# Patient Record
Sex: Female | Born: 1990 | Race: White | Hispanic: No | Marital: Single | Smoking: Current every day smoker
Health system: Southern US, Community
[De-identification: ages and names within clinical notes are randomized; demographics above are authoritative.]

## PROBLEM LIST (undated history)

## (undated) DIAGNOSIS — N390 Urinary tract infection, site not specified: Secondary | ICD-10-CM

## (undated) DIAGNOSIS — F419 Anxiety disorder, unspecified: Secondary | ICD-10-CM

## (undated) DIAGNOSIS — N200 Calculus of kidney: Secondary | ICD-10-CM

## (undated) DIAGNOSIS — R102 Pelvic and perineal pain: Secondary | ICD-10-CM

## (undated) DIAGNOSIS — N83209 Unspecified ovarian cyst, unspecified side: Secondary | ICD-10-CM

## (undated) HISTORY — DX: Pelvic and perineal pain: R10.2

## (undated) HISTORY — PX: TONSILLECTOMY: SUR1361

## (undated) HISTORY — DX: Anxiety disorder, unspecified: F41.9

---
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
## 2009-01-14 ENCOUNTER — Ambulatory Visit: Payer: Self-pay | Admitting: Obstetrics and Gynecology

## 2009-01-14 ENCOUNTER — Encounter: Payer: Self-pay | Admitting: Obstetrics and Gynecology

## 2009-04-22 ENCOUNTER — Ambulatory Visit: Payer: Self-pay | Admitting: Obstetrics & Gynecology

## 2009-04-22 LAB — CONVERTED CEMR LAB: hCG, Beta Chain, Quant, S: <2 milliintl units/mL

## 2009-05-27 ENCOUNTER — Emergency Department: Payer: Self-pay | Admitting: Emergency Medicine

## 2009-06-01 ENCOUNTER — Emergency Department: Payer: Self-pay | Admitting: Emergency Medicine

## 2009-06-17 ENCOUNTER — Inpatient Hospital Stay (HOSPITAL_COMMUNITY): Admission: AD | Admit: 2009-06-17 | Discharge: 2009-06-17 | Payer: Self-pay | Admitting: Obstetrics and Gynecology

## 2009-06-19 ENCOUNTER — Emergency Department: Payer: Self-pay | Admitting: Emergency Medicine

## 2009-07-22 ENCOUNTER — Emergency Department: Payer: Self-pay | Admitting: Unknown Physician Specialty

## 2009-07-26 ENCOUNTER — Inpatient Hospital Stay: Payer: Self-pay | Admitting: Internal Medicine

## 2009-08-12 ENCOUNTER — Ambulatory Visit: Payer: Self-pay | Admitting: Nurse Practitioner

## 2009-08-26 ENCOUNTER — Encounter: Payer: Self-pay | Admitting: Obstetrics & Gynecology

## 2009-08-26 ENCOUNTER — Ambulatory Visit: Payer: Self-pay | Admitting: Nurse Practitioner

## 2009-08-26 ENCOUNTER — Encounter: Payer: Self-pay | Admitting: Nurse Practitioner

## 2009-08-26 LAB — CONVERTED CEMR LAB
Trich, Wet Prep: NONE SEEN
Yeast Wet Prep HPF POC: NONE SEEN

## 2009-08-30 ENCOUNTER — Emergency Department: Payer: Self-pay | Admitting: Unknown Physician Specialty

## 2009-09-02 ENCOUNTER — Ambulatory Visit: Payer: Self-pay | Admitting: Family Medicine

## 2009-09-02 LAB — CONVERTED CEMR LAB
Hemoglobin: 10.5 g/dL — ABNORMAL LOW (ref 12.0–15.0)
WBC: 14.4 10*3/uL — ABNORMAL HIGH (ref 4.0–10.5)

## 2009-09-03 ENCOUNTER — Ambulatory Visit (HOSPITAL_COMMUNITY): Admission: RE | Admit: 2009-09-03 | Discharge: 2009-09-03 | Payer: Self-pay | Admitting: Otolaryngology

## 2009-10-06 ENCOUNTER — Encounter: Payer: Self-pay | Admitting: Family Medicine

## 2009-10-06 ENCOUNTER — Ambulatory Visit: Payer: Self-pay | Admitting: Obstetrics and Gynecology

## 2009-10-27 ENCOUNTER — Ambulatory Visit: Payer: Self-pay | Admitting: Obstetrics and Gynecology

## 2009-10-27 ENCOUNTER — Encounter: Payer: Self-pay | Admitting: Family Medicine

## 2009-10-27 LAB — CONVERTED CEMR LAB
Chlamydia, DNA Probe: NEGATIVE
GC Probe Amp, Genital: NEGATIVE
HCT: 36.7 % (ref 36.0–46.0)
Hepatitis B Surface Ag: NEGATIVE
MCV: 78.9 fL (ref 78.0–100.0)
Platelets: 154 10*3/uL (ref 150–400)
RBC: 4.65 M/uL (ref 3.87–5.11)
Sed Rate: 12 mm/hr (ref 0–22)

## 2009-11-03 ENCOUNTER — Emergency Department: Payer: Self-pay | Admitting: Emergency Medicine

## 2009-11-24 ENCOUNTER — Encounter: Payer: Self-pay | Admitting: Obstetrics and Gynecology

## 2009-11-24 LAB — CONVERTED CEMR LAB
HCT: 38.8 % (ref 36.0–46.0)
MCV: 82.7 fL (ref 78.0–100.0)

## 2009-12-17 ENCOUNTER — Ambulatory Visit: Payer: Self-pay | Admitting: Obstetrics & Gynecology

## 2009-12-17 LAB — CONVERTED CEMR LAB: Chlamydia, Swab/Urine, PCR: NEGATIVE

## 2009-12-18 ENCOUNTER — Encounter: Payer: Self-pay | Admitting: Obstetrics & Gynecology

## 2009-12-18 LAB — CONVERTED CEMR LAB: Trich, Wet Prep: NONE SEEN

## 2010-01-20 ENCOUNTER — Ambulatory Visit: Payer: Self-pay | Admitting: Obstetrics and Gynecology

## 2010-01-20 ENCOUNTER — Encounter: Payer: Self-pay | Admitting: Obstetrics and Gynecology

## 2010-01-21 ENCOUNTER — Encounter: Payer: Self-pay | Admitting: Obstetrics and Gynecology

## 2010-01-21 LAB — CONVERTED CEMR LAB

## 2010-02-01 ENCOUNTER — Emergency Department: Payer: Self-pay | Admitting: Emergency Medicine

## 2010-02-11 ENCOUNTER — Emergency Department: Payer: Self-pay | Admitting: Emergency Medicine

## 2010-03-19 ENCOUNTER — Emergency Department: Payer: Self-pay | Admitting: Emergency Medicine

## 2010-03-25 ENCOUNTER — Ambulatory Visit: Payer: Self-pay | Admitting: Otolaryngology

## 2010-03-30 ENCOUNTER — Emergency Department: Payer: Self-pay | Admitting: Emergency Medicine

## 2010-05-12 ENCOUNTER — Encounter (INDEPENDENT_AMBULATORY_CARE_PROVIDER_SITE_OTHER): Payer: Self-pay | Admitting: *Deleted

## 2010-05-12 ENCOUNTER — Ambulatory Visit: Payer: Self-pay | Admitting: Nurse Practitioner

## 2010-05-12 LAB — CONVERTED CEMR LAB: Chlamydia, DNA Probe: NEGATIVE

## 2010-05-13 ENCOUNTER — Encounter (INDEPENDENT_AMBULATORY_CARE_PROVIDER_SITE_OTHER): Payer: Self-pay | Admitting: *Deleted

## 2010-05-13 LAB — CONVERTED CEMR LAB: Trich, Wet Prep: NONE SEEN

## 2010-07-08 ENCOUNTER — Emergency Department: Payer: Self-pay | Admitting: Unknown Physician Specialty

## 2010-07-13 ENCOUNTER — Ambulatory Visit: Payer: Self-pay | Admitting: Obstetrics and Gynecology

## 2010-07-13 LAB — CONVERTED CEMR LAB: Chlamydia, DNA Probe: NEGATIVE

## 2010-07-14 ENCOUNTER — Encounter: Payer: Self-pay | Admitting: Obstetrics and Gynecology

## 2010-07-14 LAB — CONVERTED CEMR LAB
Trich, Wet Prep: NONE SEEN
Yeast Wet Prep HPF POC: NONE SEEN

## 2010-09-15 ENCOUNTER — Ambulatory Visit: Payer: Self-pay | Admitting: Obstetrics & Gynecology

## 2010-09-15 LAB — CONVERTED CEMR LAB
GC Probe Amp, Genital: NEGATIVE
Yeast Wet Prep HPF POC: NONE SEEN

## 2010-11-09 ENCOUNTER — Encounter: Payer: Self-pay | Admitting: Urology

## 2010-11-16 ENCOUNTER — Emergency Department (HOSPITAL_COMMUNITY)
Admission: EM | Admit: 2010-11-16 | Discharge: 2010-11-16 | Payer: Self-pay | Source: Home / Self Care | Admitting: Emergency Medicine

## 2010-11-16 LAB — URINALYSIS, ROUTINE W REFLEX MICROSCOPIC
Ketones, ur: 15 mg/dL — AB
Nitrite: POSITIVE — AB

## 2010-11-16 LAB — POCT I-STAT, CHEM 8
BUN: 15 mg/dL (ref 6–23)
Calcium, Ion: 1.16 mmol/L (ref 1.12–1.32)
Creatinine, Ser: 1 mg/dL (ref 0.4–1.2)
Glucose, Bld: 100 mg/dL — ABNORMAL HIGH (ref 70–99)
TCO2: 29 mmol/L (ref 0–100)

## 2010-11-16 LAB — POCT PREGNANCY, URINE: Preg Test, Ur: NEGATIVE

## 2010-11-16 LAB — URINE MICROSCOPIC-ADD ON

## 2010-11-23 ENCOUNTER — Emergency Department: Payer: Self-pay | Admitting: Emergency Medicine

## 2011-01-20 LAB — CBC
MCHC: 34.5 g/dL (ref 30.0–36.0)
MCV: 84.9 fL (ref 78.0–100.0)
Platelets: 290 10*3/uL (ref 150–400)
RDW: 14.8 % (ref 11.5–15.5)
WBC: 9.6 10*3/uL (ref 4.0–10.5)

## 2011-02-01 LAB — URINALYSIS, ROUTINE W REFLEX MICROSCOPIC
Bilirubin Urine: NEGATIVE
Bilirubin Urine: NEGATIVE
Ketones, ur: NEGATIVE mg/dL
Ketones, ur: 15 mg/dL — AB
Nitrite: NEGATIVE
Nitrite: POSITIVE — AB
Protein, ur: NEGATIVE mg/dL
Urobilinogen, UA: 0.2 mg/dL (ref 0.0–1.0)
Urobilinogen, UA: 1 mg/dL (ref 0.0–1.0)

## 2011-02-01 LAB — POCT PREGNANCY, URINE: Preg Test, Ur: NEGATIVE

## 2011-02-01 LAB — URINE CULTURE: Colony Count: NO GROWTH

## 2011-02-01 LAB — RAPID URINE DRUG SCREEN, HOSP PERFORMED
Amphetamines: NOT DETECTED
Barbiturates: NOT DETECTED
Benzodiazepines: NOT DETECTED
Opiates: NOT DETECTED

## 2011-02-04 ENCOUNTER — Ambulatory Visit: Payer: Self-pay | Admitting: Obstetrics & Gynecology

## 2011-03-02 NOTE — Assessment & Plan Note (Signed)
NAME:  Amanda [name], Amanda [name] NO.:  [id number]   MEDICAL RECORD NO.:  [id number]          PATIENT TYPE:  POB   LOCATION:  [facility]         FACILITY:  [facility]   PHYSICIAN:  Tinnie Gens, MD        DATE OF BIRTH:  04/12/1991   DATE OF SERVICE:  08/12/2009                                  CLINIC NOTE   The patient comes to the office today for Implanon insertion.  The  patient had a recent TAB on July 05, 2009.  She has not had any  change in her sexual partner.  She has not had any unprotected  intercourse in the past 2 weeks.  Today, she has a negative urine  pregnancy test.  We did review the consent at length.  The patient is  aware of possible side effects including irregular bleeding and possible  infection at the site of insertion.  The patient is complaining of some  pain with intercourse which has been going on for the last 2 months.  She has not had her physical in a year and half.  Implanon insertion.  Area was cleaned and marked, site was injected with  3 mL of lidocaine.  Implanon was inserted with no difficulties.  Area  was cleaned and bandaged.  The patient was instructed to leave the  bandage on for 3 days.  She could loosen it if it became too tight.   ASSESSMENT:  Contraception.   PLAN:  1. Implanon insertion.  2. The patient is asked to use backup condoms for 1 month.  3. The patient is asked to observe this site for infection.  She will      return in 2 weeks for her yearly exam, at that time we will address      the pain with intercourse.  She can call the office if she is      having any problems or questions.       Remonia Richter, NP    ______________________________  Tinnie Gens, MD    LR/MEDQ  D:  08/12/2009  T:  08/13/2009  Job:  [id number]
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
## 2011-03-02 NOTE — Assessment & Plan Note (Signed)
NAME:  Amanda [name], Amanda [name] NO.:  [id number]   MEDICAL RECORD NO.:  [id number]          PATIENT TYPE:  POB   LOCATION:  [facility] at [facility]         FACILITY:  [facility]   PHYSICIAN:  Scheryl Darter, MD       DATE OF BIRTH:  05/15/1991   DATE OF SERVICE:                                  CLINIC NOTE   The patient comes to the office today for her yearly exam.  The patient  also has several complaints today.  The first is that of a right lower  quadrant pain that has been going on since she had an abortion in  September.  Approximately, 2-3 weeks ago, she was admitted to the  hospital for pyelo as well as kidney stones.  She was getting IV fluids,  IV antibiotics and asked to follow up with a urologist.  The patient did  not keep her appointment with the urologist as she did not have her co-  pay for that day.  The patient is also complaining of some throat pain  that has been ongoing for approximately 2 weeks.  [facility]  She was told that she had a virus in her throat and was given a  lidocaine spray.  Her throat has not improved, in fact it has gotten  worse.  The patient had an Implanon inserted on August 12, 2009.  She has not  had any difficulty with that other than having this spotting.   ALLERGIES:  No known drug allergies.   MEDICATIONS:  None.  She does have Implanon for contraception.   PHYSICAL EXAMINATION:  VITAL SIGNS:  Blood pressure is 114/76, pulse is  86, weight is 107, height is 5 feet 5.  GENERAL:  A well-developed, well-nourished 20 year old Caucasian female  in no acute distress.  HEENT AND NECK:  Head is normocephalic and atraumatic.  Pupils are equal  and reactive.  Her pharynx is red.  Her tonsils are quite enlarged.  There is no obvious exudate.  She does have extensive cervical  lymphadenopathy.  CARDIAC:  Regular rate and rhythm.  LUNGS:  Clear bilaterally.  ABDOMEN:  She does have some lower not right quadrant pain, but  lower  middle pain as this is more over her bladder  PELVIC:  Genitalia externally.  The mons is shaved.  Vaginal vault has  moderate amount of blood.  Cervix is closed.  There is very slight  cervical motion tenderness.  There is no adnexal pain.  There is again  more bladder pain.  EXTREMITIES:  Warm and dry.   ASSESSMENT:  1. Yearly exam.  2. Contraception.  3. Right lower quadrant pain.  4. Pharyngitis.   PLAN:  We will do her Pap smear today.  She will also have gonorrhea,  Chlamydia, wet prep and urine done today.  For contraception, she will  continue to use the Implanon.  Once we have gotten her release of  information from [facility] and reviewed her hospital records,  she may be given Motrin 800 mg t.i.d.  We will hold on this  until we know what her hospital report says about her kidneys.  For her  pharyngitis, the patient is given a Z-Pak to take as directed.  The  patient will call back for the results of her test.      Remonia Richter, NP    ______________________________  Scheryl Darter, MD    LR/MEDQ  D:  08/26/2009  T:  08/27/2009  Job:  [id number]

## 2011-03-02 NOTE — Assessment & Plan Note (Signed)
NAME:  Amanda [name], Amanda [name] NO.:  [id number]   MEDICAL RECORD NO.:  [id number]          PATIENT TYPE:  POB   LOCATION:  [facility] at [facility]         FACILITY:  [facility]   PHYSICIAN:  Argentina Donovan, MD        DATE OF BIRTH:  [birth year]   DATE OF SERVICE:  10/27/2009                                  CLINIC NOTE   The patient is an 20 year old Caucasian female who is in last month for  her yearly exam comes back today complaining of lower pelvic pain.  It  hurts when she presses on her abdomen.  It hurts when she puts and then  takes out a tampon.  It started about 2 days ago.  She has had  unprotected sex within the last week.   On examination, her abdomen is soft, flat, slightly tender  suprapubically and tenderness seems to be increased by pressure in the  upper abdomen, but it is referred to lower abdomen.  She does not,  however, have any guarding or rebound.  Pelvic examination, external  genitalia is normal.  BUS within normal limits.  Vagina is clean, with a  small amount of blood in the vault.  The patient is on  Implanon.  Cervix is clean, nulliparous.  Uterus is anterior and normal size.  The  adnexa could be easily palpated.  The whole area is somewhat tender, but  no chandelier sign and there is a little tenderness on any kind of  motion.   My feeling is that this patient perhaps is having an early episode of  PID.  I am going to put her on doxycycline as her last visit when she  was in she had a wet prep that showed clue cells that was not treated,  but I think using broad-spectrum since she has just started this  discomfort 2 days ago and seems to be increasing.  We are going to get a  sed rate and CBC.  The patient also wanted blood work done to rule out  any other sexually transmitted disease, so we are going to do that blood  work and we have gotten a probe for [employer] and chlamydia.   WORKING IMPRESSION:  Pelvic pain of 2 days' duration, possible early  pelvic  inflammatory disease.  The patient will come back in 3 days for  evaluation.           ______________________________  Argentina Donovan, MD     PR/MEDQ  D:  10/27/2009  T:  10/28/2009  Job:  [id number]

## 2011-03-02 NOTE — Assessment & Plan Note (Signed)
NAME:  Amanda [name], Amanda [name] NO.:  [id number]   MEDICAL RECORD NO.:  [id number]          PATIENT TYPE:  POB   LOCATION:  [facility]         FACILITY:  [facility]   PHYSICIAN:  Argentina Donovan, MD        DATE OF BIRTH:  [birth year]   DATE OF SERVICE:  04/02/2008                                  CLINIC NOTE   HISTORY OF PRESENT ILLNESS:  The patient is a 20 year old, gravida 3,  para 0-0-2-0 at 34 and 1 week's gestation who came in earlier than her  routine visit because of sore throat, earaches bilaterally, and  generalized myalgia.  The patient denies coughing or sneezing.  No one  in the family has had any like infection and her temperature has been  fairly normal with the highest at 99.7.   PHYSICAL EXAMINATION:  HEENT:  The throat was markedly injected without  exudate.  The ear examination is normal.  Submandibular adenopathy,  especially on the left, with fairly tender lymph nodes.  NECK:  Supple.  LUNGS:  Clear to auscultation and percussion.  HEART:  No murmurs.  Normal sinus rhythm.  ABDOMEN:  Soft, flat, and nontender.  No masses or organomegaly.   HOSPITAL COURSE:  The patient also during her first OB visit 3 weeks ago  had a positive Chlamydia probe, was treated on March 20, 2008, with  Zithromax.  Repeat Chlamydia probe was taken today for cure.  The  impression is that the patient has a viral upper respiratory infection.  We instructed her to return in 1-week for followup.  If the infection  seems to be getting worse and she is not getting better over the next  few days, she should call and so we did send her into the emergency room  for a flu test.  The patient has been instructed to get as much bedrest  as possible, drink plenty of fluids, and take Tylenol  for her muscle  pain.  The patient states that she did take her Zithromax as instructed.  Her partner, however, has not been treated.  She has not had sex with  him and will not until he is treated and  she states that he is no way of  getting [facility] and no money or insurance for private  doctor to be treated.  We discussed the importance of him being treated  and hope that she will convince him to follow through.   IMPRESSION:  Viral upper respiratory infection, Chlamydia probe for  cure.  Fetal heart during this visit was normal.  Blood pressure was  111/62.  The patient weighed 122 pounds.           ______________________________  Argentina Donovan, MD     PR/MEDQ  D:  04/02/2008  T:  04/03/2008  Job:  [id number]

## 2011-03-02 NOTE — Assessment & Plan Note (Signed)
NAME:  Amanda, [name] NO.:  [id number]   MEDICAL RECORD NO.:  [id number]          PATIENT TYPE:  POB   LOCATION:  [facility]         FACILITY[facility]  [facility]   PHYSICIAN:  Catalina Antigua, MD     DATE OF BIRTH:  1991-03-14   DATE OF SERVICE:  01/20/2010                                  CLINIC NOTE   HISTORY OF PRESENT ILLNESS:  This is a 20 year old G3, P1-0-2-1, who  presents today for evaluation of pelvic pain and vaginal discharge.  The  patient states that she was seen earlier in March for the same  complaints and was found to have yeast and bacterial vaginosis and was  appropriately treated with Diflucan and Flagyl.  States that her  symptoms have resolved.  She recently completed a course of amoxicillin  for strep throat and now her symptoms are recurring.  She describes a  thick vaginal discharge, dyspareunia, and vaginal pruritus.  She is also  complaining of pain with urination and feels that experiences frequency,  but is only able to void a small amount.   PHYSICAL EXAMINATION:  VITAL SIGNS:  Blood pressure is 109/64, pulse of  69, weight of 115 pounds, and height 5 feet 5 inches.  ABDOMEN:  Soft, nontender, nondistended.  PELVIC:  Normal-appearing external genitalia.  Normal vaginal mucosa and  cervix with thin white discharge.  No abnormal bleeding.  On bimanual  exam, she has small anteverted uterus, no palpable adnexal masses or  tenderness.  Her urine analysis demonstrated trace blood and 3+ protein.   ASSESSMENT AND PLAN:  This is a 19 year old who presents for evaluation  of pelvic pain and vaginal discharge, status post course of antibiotics  with prep was performed today and the urine culture was sent.  The  patient will be contacted with any abnormal results.  Her symptoms that  are likely due to recurrence of either yeast infection or bacterial  vaginosis.           ______________________________  Catalina Antigua, MD     PC/MEDQ  D:   01/20/2010  T:  01/21/2010  Job:  [id number]

## 2011-03-02 NOTE — Assessment & Plan Note (Signed)
NAME:  Amanda [name], Amanda [name] NO.:  [id number]   MEDICAL RECORD NO.:  [id number]          PATIENT TYPE:  POB   LOCATION:  [facility]         FAC[facility]   PHYSICIAN:  Argentina Donovan, MD        DATE OF BIRTH:  1991/02/14   DATE OF SERVICE:                                  CLINIC NOTE   HISTORY OF PRESENT ILLNESS:  The patient is a 20 year old Caucasian  female gravida 3, para 1-0-2-1 whose child is 64 months old, and then in  some time last year she had a pregnancy termination after that went on  Implanon for contraception.  Since that time, she has had problems with  the increased vaginal discharge, irritating, sometimes odor is difficult  to describe but heavy.  She has also been bothered by sharp intermittent  stabbing lower abdominal pains in both lower quadrants, but mainly on  the left.  She has been treated in recent past for Trichomonas,  bacterial vaginosis, and yeast which were confirmed by wet prep and  cultures.  She comes in today after having had her last treatment back  at the end of July beginning of August with the same complaints, sharp,  intermittent, stabbing lower abdominal pain and extremely heavy vaginal  discharge.  On examination, her abdomen is soft, flat, nontender.  No  masses or organomegaly.  No deep palpation significant pain.  No  rebound.  External genitalia is normal.  BUN is within normal limits.  The vagina was well rugated with extremely heavy creamy white  leukorrhea, thick and pasty in nature, and non foul with a negative  whiff.  GC and Chlamydia probe was taken from the cervix and wet prep  was also taken.  On bimanual examination, the seem to be a fullness and  certainly a tenderness in the left lower quadrant giving impression of a  small ovarian cyst.  The right lower quadrant was somewhat benign and  the uterus did not elicit pain on motion.   IMPRESSION:  Heavy leukorrhea, pending laboratory tests and pelvic  pain.   PLAN:  I am going to get an ultrasound, have her come back in 2 weeks  for those results and then depending on the wet prep and probes  discussed the possibility of treatment with this patient.  The fact is  that most of these things the heavy discharge started since the Implanon  and so I have talked to the patient about the possibility of either  changing her contraception method or doing something to increase vaginal  acidity to try and make these infections to occur less.           ______________________________  Argentina Donovan, MD     PR/MEDQ  D:  07/13/2010  T:  07/14/2010  Job:  [id number]

## 2011-03-02 NOTE — Assessment & Plan Note (Signed)
NAME:  Amanda [name], Amanda [name] NO.:  [id number]   MEDICAL RECORD NO.:  [id number]          PATIENT TYPE:  POB   LOCATION:  [facility] at [facility]         FACILITY:  [facility]   PHYSICIAN:  Scheryl Darter, MD       DATE OF BIRTH:  01/30/1991   DATE OF SERVICE:  05/12/2010                                  CLINIC NOTE   The patient comes to office today complaining of an ongoing vaginal  discharge.  She states this vaginal discharge started approximately 1  month ago.  It does have an odor, but she cannot seem to describe that  odor.  She feels that the amount is excessive and in fact wears a pad  and sometimes a tampon.  She did call the office about 2 weeks ago,  described her symptoms, and was called in a Diflucan and that was not  helpful.  She does not have a new sexual partner.  She is currently  sexually active.  She has Implanon for contraception.   PHYSICAL EXAMINATION:  VITAL SIGNS:  Blood pressure is 89/61, pulse is  95, weight is 113.  GENERAL:  A well-developed, well-nourished 20 year old Caucasian female  in no acute distress.  GENITALIA:  Externally, the mons is shaved.  There are no lesions or  discharge.  Internally, vaginal vault, there is a very scant amount of  white discharge, nonodorous.  Cervix is without lesions or discharges.  Bimanual exam, no cervical motion tenderness.  No adnexal mass.   ASSESSMENT:  Vaginal discharge.   PLAN:  We will do a wet prep, GC and Chlamydia today.  We did have  discussion about the vaginal discharge in females as well as pelvic pain  and sexual activity.  She will follow up as needed.      Remonia Richter, NP    ______________________________  Scheryl Darter, MD    LR/MEDQ  D:  05/12/2010  T:  05/13/2010  Job:  [id number]

## 2011-03-02 NOTE — Assessment & Plan Note (Signed)
NAME:  Amanda [name], Amanda [name] NO.:  [id number]   MEDICAL RECORD NO.:  [id number]          PATIENT TYPE:  POB   LOCATION:  [facility] at [facility]         FACILITY:  [facility]   PHYSICIAN:  Scheryl Darter, MD       DATE OF BIRTH:  01-Nov-1990   DATE OF SERVICE:  12/17/2009                                  CLINIC NOTE   The patient comes today with 2 days noting a new piece of skin at the  introitus, it was uncomfortable with intercourse.  She has had some  vaginal itching and she suspects she may have yeast.  She has frequent  bleeding after insertion of Implanon about 4 months ago.  She had a  vaginal delivery 14 months ago.  She has no reports of ulceration, no  vaginal area.  She says her sexual partner has diagnosis of herpes, but  she has never had symptoms herself.  She said she has not been tested  for more sexually transmitted diseases.   PAST MEDICAL HISTORY:  Urinary tract infections.  She had previously  been followed by a urologist.   PHYSICAL EXAMINATION:  No acute distress.  Affect appears normal.  External genitalia show a well healed episiotomy or laceration in site  of the perineum.  It was not tender.  There is well-estrogenized vaginal  mucosa and cervix appears normal with a slight whitish discharge.  At  the introitus, there is a hymeneal ring remnants, but no warts or other  lesions.   IMPRESSION:  Symptoms at the introitus may be secondary to mild yeast  infection.  I see no new lesions today.   PLAN:  She said she has Diflucan which she can take and she gets home.  She wants more testing for STDs and we discussed screening for exposure  to herpes simplex virus.  She will come back to have serum test for  antibodies to HSV 1 and 2.  We will also do gonorrhea and chlamydia  probes from a urine specimen.  Urinalysis was negative today.  We will  notify her results for testing.      Scheryl Darter, MD     JA/MEDQ  D:  12/17/2009  T:  12/18/2009  Job:   [id number]

## 2011-03-02 NOTE — Assessment & Plan Note (Signed)
NAME:  Amanda [name], Amanda [name] NO.:  [id number]   MEDICAL RECORD NO.:  [id number]          PATIENT TYPE:  POB   LOCATION:  [facility] at [facility]         FACILITY:  [facility]   PHYSICIAN:  Argentina Donovan, MD        DATE OF BIRTH:  April 01, 1991   DATE OF SERVICE:  01/13/2009                                  CLINIC NOTE   The patient is an 20 year old Caucasian female gravida 3, para 1-0-2-0  who delivered her baby on September 26, 2008, by normal spontaneous  delivery.  She has not been back for her 6-week checkup.  She comes in  today complaining of little bumps around her vaginal area.  She is also  due for a Pap smear and wanted to start on birth control pills.  She  said the bumps came up about a week ago and they have been very itchy.  She has had a recent history of scabies, which she thought was cleared  up.   On examination, the vulva and the external genitalia has been shaven.  There were small umbilicated lesions measuring about 2-mm in diameter,  mostly on the left side of the labia majora in the groin area and in the  mound of venus.  These are very typical molluscum contagiosum.  The  external genitalia otherwise is normal.  BUN is within normal limits.  Vagina is clean and well rugated.  Cervix, clean and parous.  Uterus is  anterior, normal size, shape, consistency.  The adnexa is normal.  Pap  smear was taken.  I have discussed the condition with the patient.  I  told to make sure she washes her hands if she touches or scratches that  area before she plays with the baby as children can pick this up also,  and that if it does not go away within a couple months to see  dermatologist who can use a little curetting to get rid off them, but  most of the time, they are going to go away on their own.  I counseled  her to stop smoking that may help reduce the problem there and she  wanted the birth control pills.  She has been on Yaz and Ortho Tri-  Cyclen, had problems with  bleeding, so we are going to start her on  Loestrin Fe 24.  I have counseled her on how to take them.  Her blood  pressure today is 111/77, pulse of 81.  She was 119 pounds, 5 feet 5  inches tall.  Given her a prescription for a year.   IMPRESSION:  Molluscum contagiosum.  Routine gynecologic examination  with Pap smear taken.           ______________________________  Argentina Donovan, MD     PR/MEDQ  D:  01/14/2009  T:  01/15/2009  Job:  [id number]
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
## 2011-07-20 LAB — WET PREP, GENITAL
Trich, Wet Prep: NONE SEEN
Yeast Wet Prep HPF POC: NONE SEEN
Yeast Wet Prep HPF POC: NONE SEEN

## 2011-07-20 LAB — URINALYSIS, ROUTINE W REFLEX MICROSCOPIC
Glucose, UA: NEGATIVE
Hgb urine dipstick: NEGATIVE
Ketones, ur: NEGATIVE
Nitrite: NEGATIVE
Nitrite: NEGATIVE
Protein, ur: NEGATIVE
Protein, ur: NEGATIVE
Specific Gravity, Urine: 1.015
Urobilinogen, UA: 0.2
pH: 6.5

## 2011-07-20 LAB — URINE CULTURE
Colony Count: 9000
Colony Count: NO GROWTH
Culture: NO GROWTH

## 2011-07-20 LAB — URINE MICROSCOPIC-ADD ON

## 2011-07-23 LAB — URINALYSIS, ROUTINE W REFLEX MICROSCOPIC
Ketones, ur: NEGATIVE mg/dL
Nitrite: NEGATIVE
pH: 7 (ref 5.0–8.0)

## 2011-07-23 LAB — CBC
Hemoglobin: 10.4 g/dL — ABNORMAL LOW (ref 12.0–15.0)
Platelets: 162 10*3/uL (ref 150–400)
RDW: 14 % (ref 11.5–15.5)
WBC: 11.4 10*3/uL — ABNORMAL HIGH (ref 4.0–10.5)

## 2011-07-27 LAB — HCG, QUANTITATIVE, PREGNANCY
hCG, Beta Chain, Quant, S: 45 — ABNORMAL HIGH
hCG, Beta Chain, Quant, S: 92 — ABNORMAL HIGH

## 2011-07-27 LAB — COMPREHENSIVE METABOLIC PANEL
ALT: 11
Calcium: 9.4
Creatinine, Ser: 0.59
Glucose, Bld: 87
Sodium: 139
Total Protein: 7.3

## 2011-07-27 LAB — CBC
Hemoglobin: 14.1
MCV: 89.8
RBC: 4.54
WBC: 9.1

## 2011-07-27 LAB — ABO/RH: ABO/RH(D): O POS

## 2011-08-04 LAB — DIFFERENTIAL
Basophils Absolute: 0
Basophils Relative: 0
Lymphocytes Relative: 12 — ABNORMAL LOW
Monocytes Absolute: 0.3
Monocytes Relative: 2 — ABNORMAL LOW
Neutro Abs: 10.9 — ABNORMAL HIGH
Neutrophils Relative %: 85 — ABNORMAL HIGH

## 2011-08-04 LAB — HEPATIC FUNCTION PANEL
ALT: 9
AST: 20
Albumin: 3.6
Alkaline Phosphatase: 45 — ABNORMAL LOW
Bilirubin, Direct: <0.1
Total Bilirubin: 0.5

## 2011-08-04 LAB — RPR: RPR Ser Ql: NONREACTIVE

## 2011-08-04 LAB — RAPID URINE DRUG SCREEN, HOSP PERFORMED
Amphetamines: NOT DETECTED
Benzodiazepines: POSITIVE — AB
Tetrahydrocannabinol: POSITIVE — AB

## 2011-08-04 LAB — URINALYSIS, ROUTINE W REFLEX MICROSCOPIC
Bilirubin Urine: NEGATIVE
Glucose, UA: NEGATIVE
Hgb urine dipstick: NEGATIVE
Ketones, ur: 40 — AB
Nitrite: NEGATIVE
Protein, ur: >300 — AB
Specific Gravity, Urine: 1.027
Urobilinogen, UA: 0.2
Urobilinogen, UA: 1
pH: 6

## 2011-08-04 LAB — I-STAT 8, (EC8 V) (CONVERTED LAB)
Acid-base deficit: 3 — ABNORMAL HIGH
Bicarbonate: 19.9 — ABNORMAL LOW
HCT: 41
Operator id: 282201
Sodium: 137
TCO2: 21
pCO2, Ven: 28.8 — ABNORMAL LOW

## 2011-08-04 LAB — CBC
Hemoglobin: 13.4
MCHC: 33.8
RBC: 4.46
RDW: 12.9

## 2011-08-04 LAB — GC/CHLAMYDIA PROBE AMP, URINE
Chlamydia, Swab/Urine, PCR: NEGATIVE
GC Probe Amp, Urine: NEGATIVE

## 2011-08-04 LAB — URINE MICROSCOPIC-ADD ON

## 2011-08-04 LAB — ACETAMINOPHEN LEVEL: Acetaminophen (Tylenol), Serum: <10 — ABNORMAL LOW

## 2011-08-04 LAB — POCT PREGNANCY, URINE
Operator id: 282201
Preg Test, Ur: NEGATIVE

## 2011-08-04 LAB — ETHANOL: Alcohol, Ethyl (B): <5

## 2011-08-05 ENCOUNTER — Emergency Department: Payer: Self-pay | Admitting: Emergency Medicine

## 2012-01-20 ENCOUNTER — Emergency Department (HOSPITAL_COMMUNITY)
Admission: EM | Admit: 2012-01-20 | Discharge: 2012-01-20 | Disposition: A | Discharge status: Home / Self Care | Payer: 59 | Attending: Emergency Medicine | Admitting: Emergency Medicine

## 2012-01-20 DIAGNOSIS — K006 Disturbances in tooth eruption: Secondary | ICD-10-CM | POA: Insufficient documentation

## 2012-01-20 DIAGNOSIS — K089 Disorder of teeth and supporting structures, unspecified: Secondary | ICD-10-CM | POA: Insufficient documentation

## 2012-01-20 DIAGNOSIS — K0889 Other specified disorders of teeth and supporting structures: Secondary | ICD-10-CM

## 2012-01-20 MED ORDER — OXYCODONE-ACETAMINOPHEN 5-325 MG PO TABS
2.0000 | ORAL_TABLET | Freq: Once | ORAL | Status: AC
  Administered 2012-01-20: 2 via ORAL
  Filled 2012-01-20: qty 2

## 2012-01-20 MED ORDER — OXYCODONE-ACETAMINOPHEN 5-325 MG PO TABS: 2.0000 | ORAL_TABLET | ORAL | Status: AC | PRN

## 2012-01-20 MED ORDER — PENICILLIN V POTASSIUM 500 MG PO TABS: 500.0000 mg | ORAL_TABLET | Freq: Four times a day (QID) | ORAL | Status: AC

## 2012-01-20 NOTE — ED Notes (Signed)
Pt c/o toothache on left side upper back tooth and lower back tooth. Constant stabbing pain radiates to ear. Appoint with oral surgeon next Thursday.

## 2012-01-20 NOTE — ED Provider Notes (Signed)
History     CSN: [id number]  Arrival date & time 01/20/12  1022   First MD Initiated Contact with Patient 01/20/12 1101      Chief Complaint  Patient presents with  . Dental Pain    (Consider location/radiation/quality/duration/timing/severity/associated sxs/prior treatment) HPI Comments: Patient presents with pain in her left upper and lower teeth for the past 3 weeks. She denies any trauma. She does not have any fever, difficulty breathing, difficulty swallowing, vomiting. She's no other medical problems. She taking Tylenol and ibuprofen without relief. His appointment with her oral surgeon next Thursday to get her wisdom teeth removed.  The history is provided by the patient.    No past medical history on file.  No past surgical history on file.  No family history on file.  History  Substance Use Topics  . Smoking status: Not on file  . Smokeless tobacco: Not on file  . Alcohol Use: Not on file    OB History    No data available      Review of Systems  Constitutional: Negative for fever, activity change and appetite change.  HENT: Positive for dental problem. Negative for sore throat, facial swelling and trouble swallowing.   Respiratory: Negative for cough and shortness of breath.   Cardiovascular: Negative for chest pain.  Gastrointestinal: Negative for nausea and vomiting.  Genitourinary: Negative for dysuria.  Musculoskeletal: Negative for back pain.  Neurological: Negative for headaches.    Allergies  Aspirin  Home Medications   Current Outpatient Rx  Name Route Sig Dispense Refill  . IBUPROFEN 200 MG PO TABS Oral Take 400 mg by mouth every 6 (six) hours as needed. For pain    . QUETIAPINE FUMARATE 50 MG PO TABS Oral Take 50 mg by mouth at bedtime.      BP 108/73  Pulse 80  Temp(Src) 98.3 F (36.8 C) (Oral)  Resp 16  SpO2 98%  Physical Exam  Constitutional: She is oriented to person, place, and time. She appears well-developed and  well-nourished. No distress.  HENT:  Head: Normocephalic.  Mouth/Throat: Oropharynx is clear and moist. No oropharyngeal exudate.         Floor of mouth soft  Eyes: Conjunctivae are normal. Pupils are equal, round, and reactive to light.  Neck: Neck supple.       No meningismus  Cardiovascular: Normal rate, regular rhythm and normal heart sounds.   Pulmonary/Chest: Effort normal and breath sounds normal. No respiratory distress.  Abdominal: Soft. There is no tenderness. There is no rebound and no guarding.  Musculoskeletal: Normal range of motion. She exhibits no edema and no tenderness.  Neurological: She is alert and oriented to person, place, and time. No cranial nerve deficit.  Skin: Skin is warm.    ED Course  Procedures (including critical care time)  Labs Reviewed - No data to display No results found.   No diagnosis found.    MDM  Dental pain with difficulty breathing or swallowing. No evidence of Ludwig's angina. No abscess  Pain control, followup with oral surgeon as scheduled        Glynn Octave, MD 01/20/12 [phone]

## 2012-01-20 NOTE — Discharge Instructions (Signed)
Dental Pain  A tooth ache may be caused by cavities (tooth decay). Cavities expose the nerve of the tooth to air and hot or cold temperatures. It may come from an infection or abscess (also called a boil or furuncle) around your tooth. It is also often caused by dental caries (tooth decay). This causes the pain you are having.  DIAGNOSIS   Your caregiver can diagnose this problem by exam.  TREATMENT   · If caused by an infection, it may be treated with medications which kill germs (antibiotics) and pain medications as prescribed by your caregiver. Take medications as directed.  · Only take over-the-counter or prescription medicines for pain, discomfort, or fever as directed by your caregiver.  · Whether the tooth ache today is caused by infection or dental disease, you should see your dentist as soon as possible for further care.  SEEK MEDICAL CARE IF:  The exam and treatment you received today has been provided on an emergency basis only. This is not a substitute for complete medical or dental care. If your problem worsens or new problems (symptoms) appear, and you are unable to meet with your dentist, call or return to this location.  SEEK IMMEDIATE MEDICAL CARE IF:   · You have a fever.  · You develop redness and swelling of your face, jaw, or neck.  · You are unable to open your mouth.  · You have severe pain uncontrolled by pain medicine.  MAKE SURE YOU:   · Understand these instructions.  · Will watch your condition.  · Will get help right away if you are not doing well or get worse.  Document Released: 10/04/2005 Document Revised: 09/23/2011 Document Reviewed: 05/22/2008  ExitCare® Patient Information ©2012 [employer].

## 2012-01-20 NOTE — ED Notes (Signed)
Discharge home with written and verbal instruction. Prescription and follow up care discussed.

## 2012-02-08 ENCOUNTER — Emergency Department (HOSPITAL_COMMUNITY)
Admission: EM | Admit: 2012-02-08 | Discharge: 2012-02-08 | Disposition: A | Discharge status: Home / Self Care | Payer: Self-pay | Attending: Emergency Medicine | Admitting: Emergency Medicine

## 2012-02-08 ENCOUNTER — Encounter (HOSPITAL_COMMUNITY): Payer: Self-pay | Admitting: *Deleted

## 2012-02-08 DIAGNOSIS — R1110 Vomiting, unspecified: Secondary | ICD-10-CM | POA: Insufficient documentation

## 2012-02-08 DIAGNOSIS — R070 Pain in throat: Secondary | ICD-10-CM | POA: Insufficient documentation

## 2012-02-08 DIAGNOSIS — K089 Disorder of teeth and supporting structures, unspecified: Secondary | ICD-10-CM | POA: Insufficient documentation

## 2012-02-08 NOTE — ED Notes (Signed)
Pt called for in waiting room x2 with no reply

## 2012-02-08 NOTE — ED Notes (Signed)
Toothache for 3 weeks and she has a sorethoat and vomiting for several days

## 2012-05-15 ENCOUNTER — Emergency Department: Payer: Self-pay | Admitting: Emergency Medicine

## 2012-05-15 LAB — DRUG SCREEN, URINE
Amphetamines, Ur Screen: NEGATIVE (ref ?–1000)
Barbiturates, Ur Screen: NEGATIVE (ref ?–200)
Benzodiazepine, Ur Scrn: POSITIVE (ref ?–200)
Cannabinoid 50 Ng, Ur ~~LOC~~: NEGATIVE (ref ?–50)
Cocaine Metabolite,Ur ~~LOC~~: NEGATIVE (ref ?–300)
MDMA (Ecstasy)Ur Screen: NEGATIVE (ref ?–500)
Opiate, Ur Screen: POSITIVE (ref ?–300)
Tricyclic, Ur Screen: NEGATIVE (ref ?–1000)

## 2012-05-15 LAB — SALICYLATE LEVEL: Salicylates, Serum: <1.7 mg/dL

## 2012-05-15 LAB — COMPREHENSIVE METABOLIC PANEL
Anion Gap: 8 (ref 7–16)
BUN: 12 mg/dL (ref 7–18)
Bilirubin,Total: 0.9 mg/dL (ref 0.2–1.0)
Calcium, Total: 8.4 mg/dL — ABNORMAL LOW (ref 8.5–10.1)
Chloride: 104 mmol/L (ref 98–107)
EGFR (African American): >60
Glucose: 89 mg/dL (ref 65–99)
Osmolality: 277 (ref 275–301)
Potassium: 3.6 mmol/L (ref 3.5–5.1)
SGPT (ALT): 20 U/L
Sodium: 139 mmol/L (ref 136–145)
Total Protein: 7 g/dL (ref 6.4–8.2)

## 2012-05-15 LAB — CBC
HCT: 35.6 % (ref 35.0–47.0)
Platelet: 126 10*3/uL — ABNORMAL LOW (ref 150–440)
RDW: 13.2 % (ref 11.5–14.5)
WBC: 4.5 10*3/uL (ref 3.6–11.0)

## 2012-05-15 LAB — URINALYSIS, COMPLETE
Blood: NEGATIVE
Ph: 6 (ref 4.5–8.0)
Protein: NEGATIVE
Specific Gravity: 1.015 (ref 1.003–1.030)
Squamous Epithelial: 8
WBC UR: 5 /HPF (ref 0–5)

## 2012-05-15 LAB — ETHANOL
Ethanol %: <0.003 % (ref 0.000–0.080)
Ethanol: <3 mg/dL

## 2012-05-15 LAB — PROTIME-INR
INR: 1.2
Prothrombin Time: 15.7 secs — ABNORMAL HIGH (ref 11.5–14.7)

## 2012-05-21 LAB — CULTURE, BLOOD (SINGLE)

## 2013-05-08 DIAGNOSIS — R102 Pelvic and perineal pain: Secondary | ICD-10-CM

## 2013-05-08 HISTORY — DX: Pelvic and perineal pain: R10.2

## 2013-07-25 IMAGING — CR DG CHEST 1V PORT
1 series · 1 of 1 positions shown · non-contrast
Comparison: none

REASON FOR EXAM: altered mental status
COMMENTS:

PROCEDURE:     DXR - DXR PORTABLE CHEST SINGLE VIEW  - May 15, 2012  [DATE]
RESULT:     The lungs are hypoinflated. There is no focal infiltrate. Mild
increase in the infrahilar lung markings on the left are present. There is
no pleural effusion. The mediastinum is normal in width.

[ap]
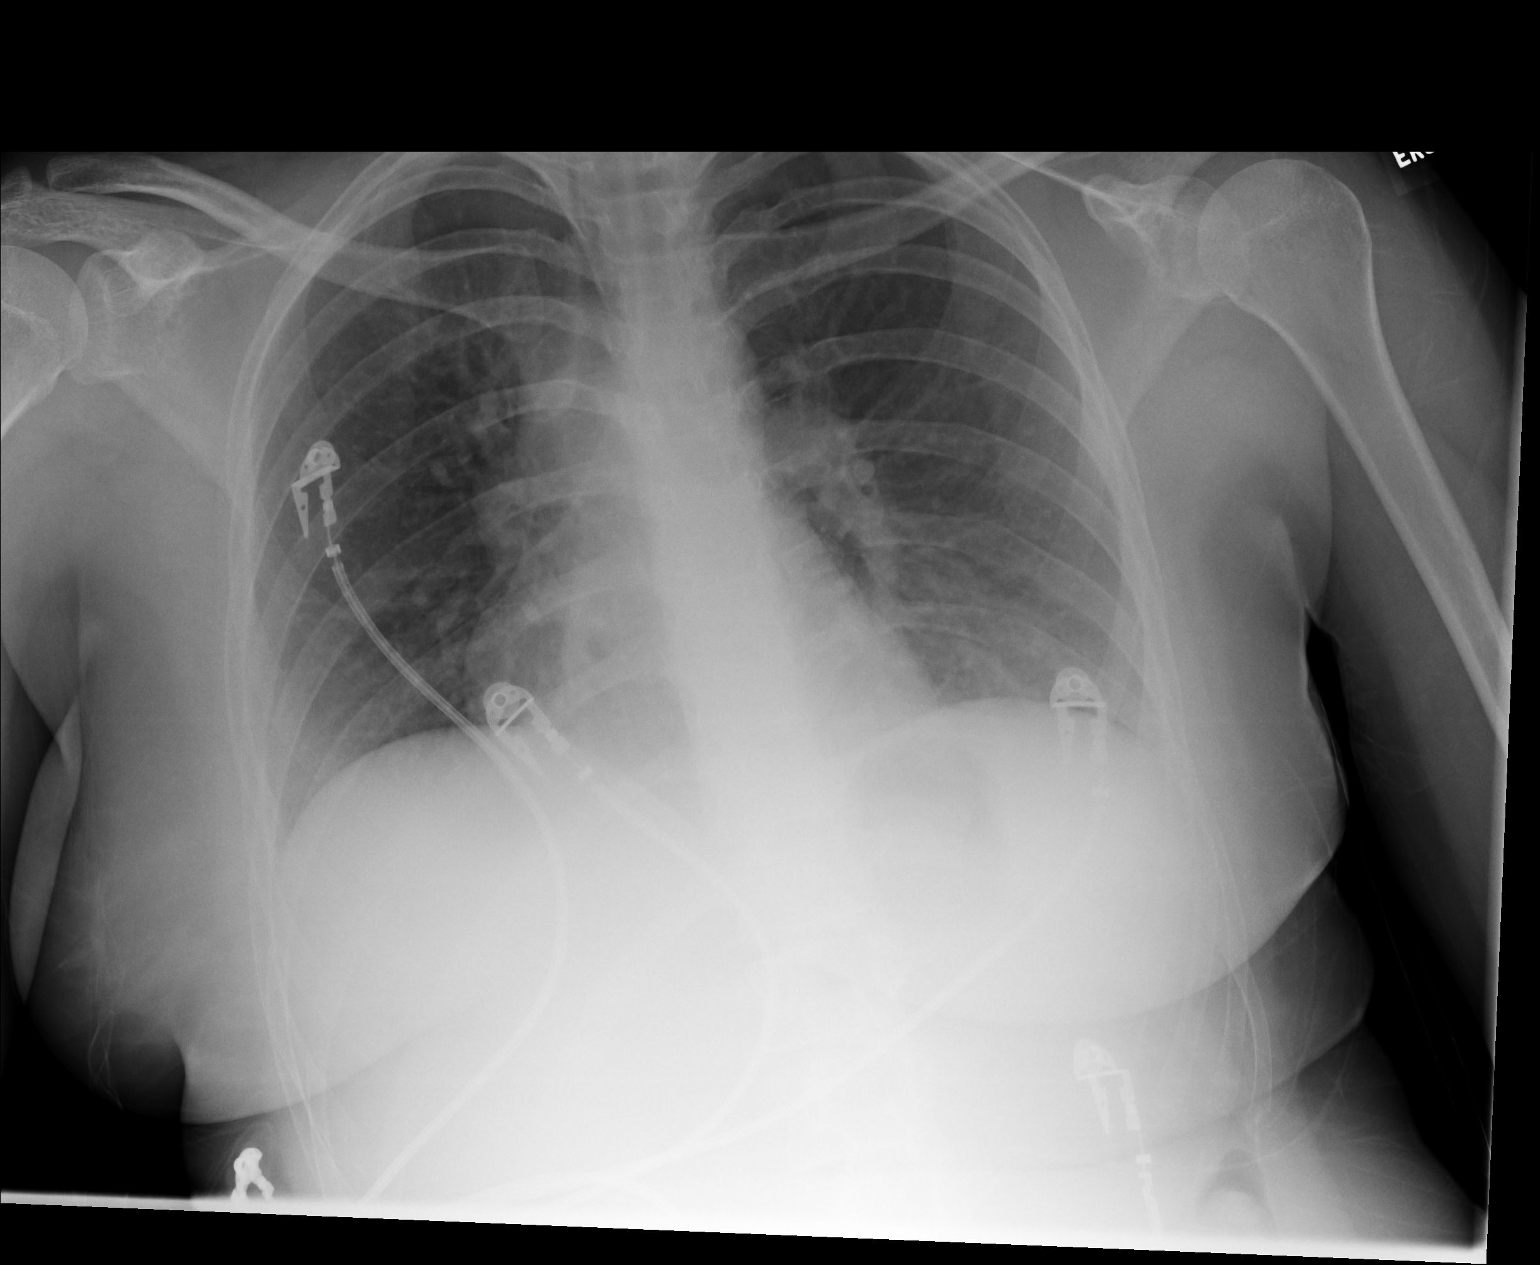

[1 of 1 positions shown; findings below may reference images not displayed]

IMPRESSION: I cannot exclude infrahilar subsegmental atelectasis on the
left. Followup PA and lateral chest films would be of value when the patient
can tolerate the procedure.

[REDACTED]

## 2013-07-25 IMAGING — CT CT HEAD WITHOUT CONTRAST
2 series · 16 of 30 positions shown, 20 images · non-contrast
Comparison: none

REASON FOR EXAM: fever, altered mental status
COMMENTS:

PROCEDURE:     CT  - CT HEAD WITHOUT CONTRAST  - May 15, 2012  [DATE]
RESULT:     History: Altered mental status and fever.
Comparison Study: No prior.

[Series 2: without · axial · non-contrast · 0.39mm/px · z∈[+458,+583]mm · 13 of 31 slices shown, 17 images]
[im 3/31  brain]
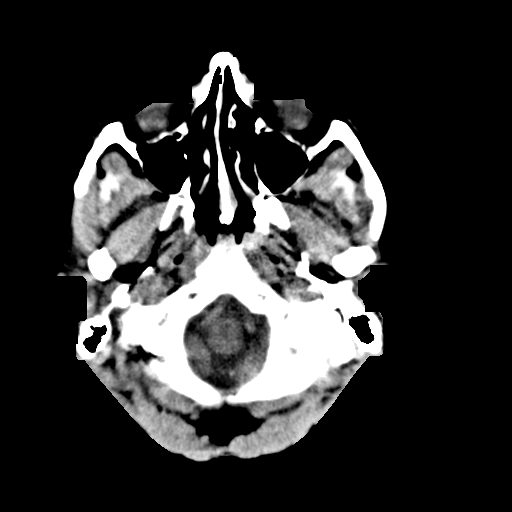
[im 3/31  bone]
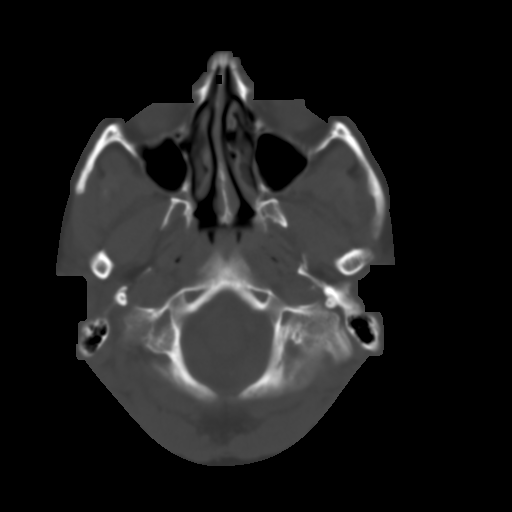
[im 5/31  brain]
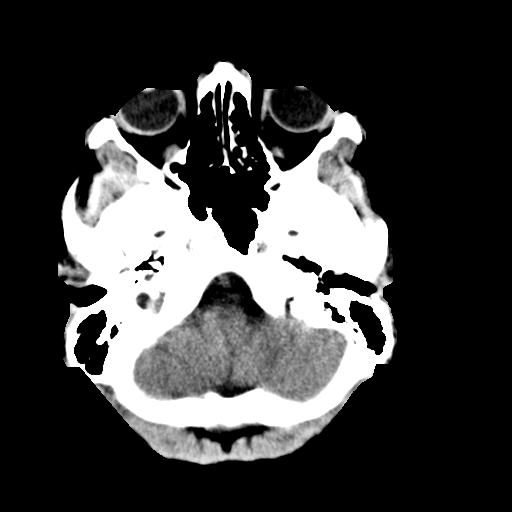
[im 7/31  brain]
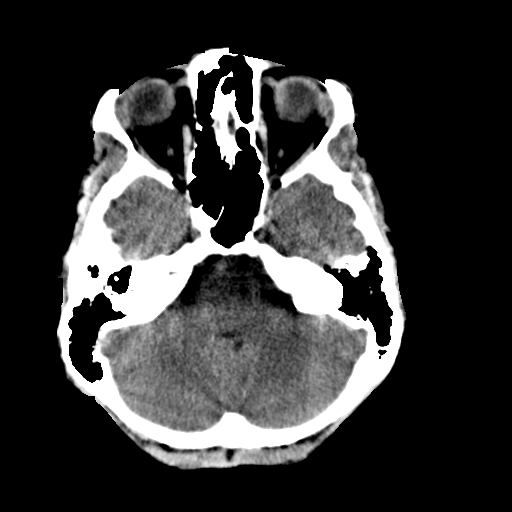
[im 9/31  brain]
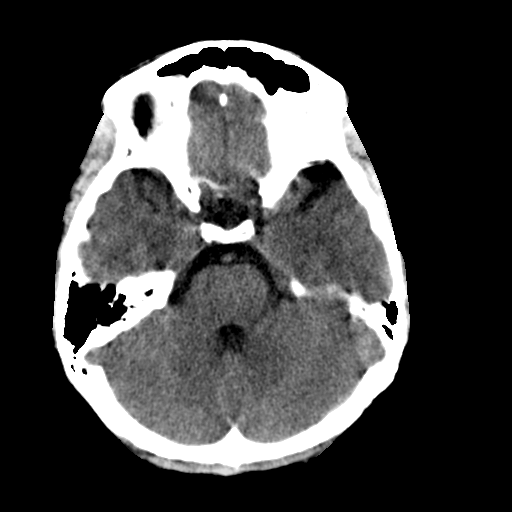
[im 11/31  brain]
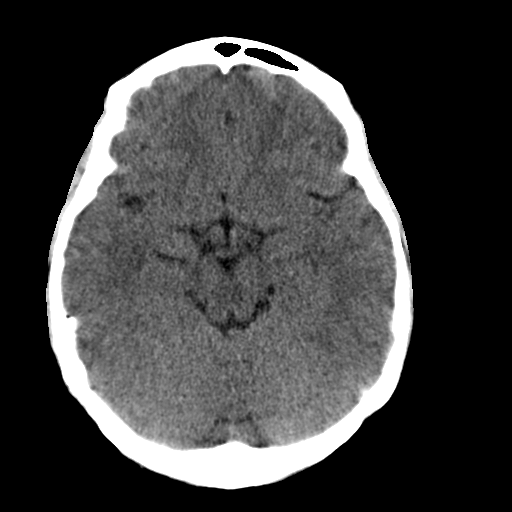
[im 11/31  bone]
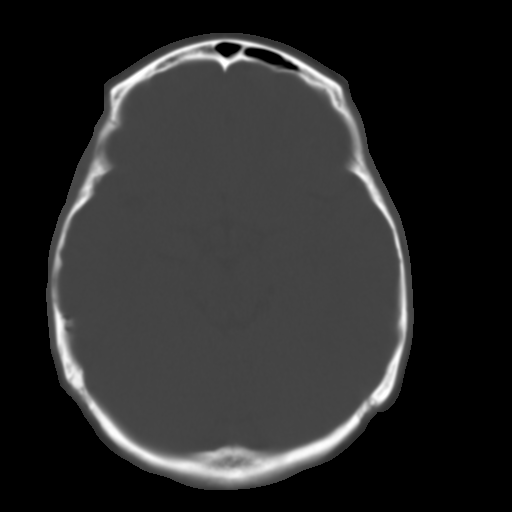
[im 13/31  brain]
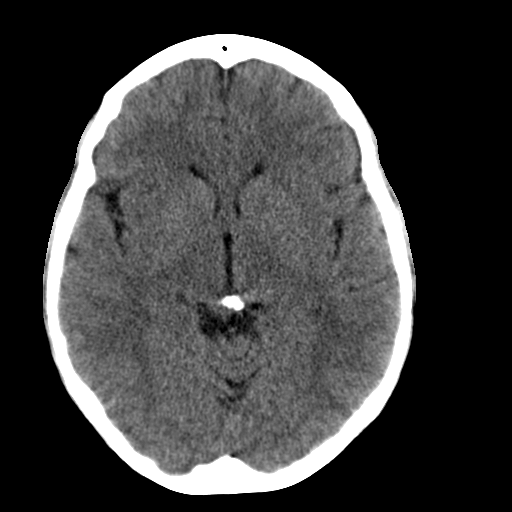
[im 16/31  brain]
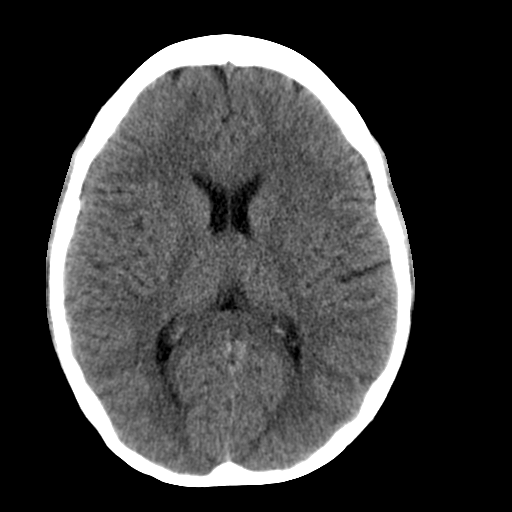
[im 18/31  brain]
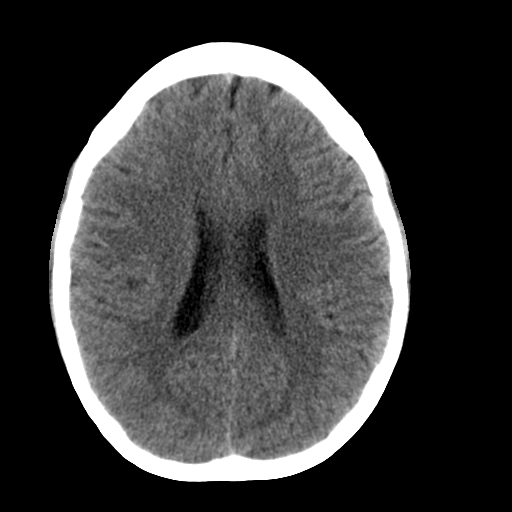
[im 20/31  brain]
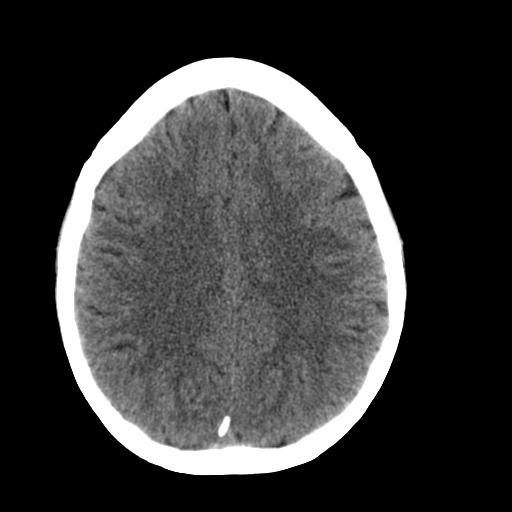
[im 20/31  bone]
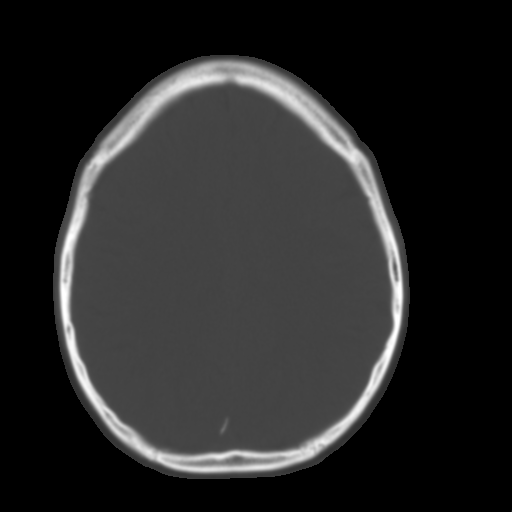
[im 22/31  brain]
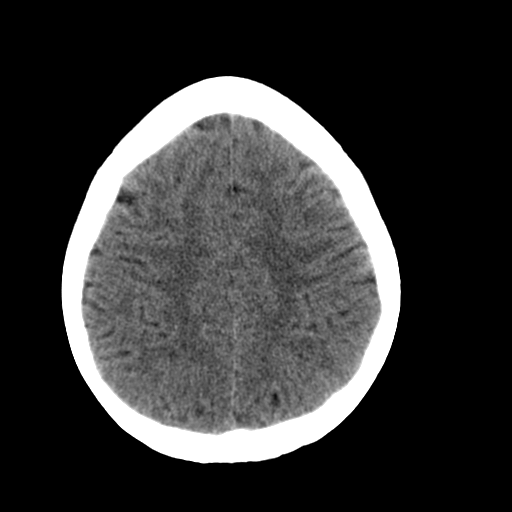
[im 24/31  brain]
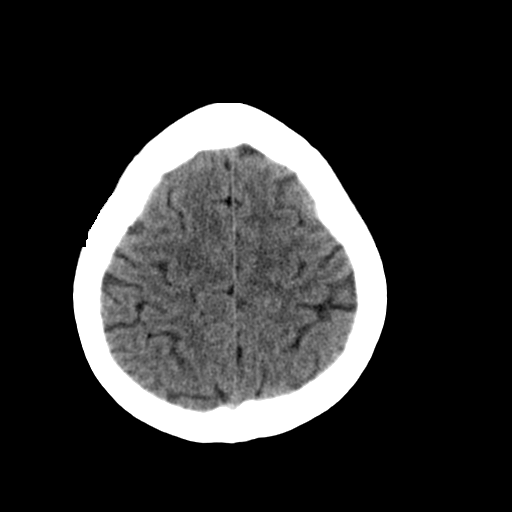
[im 26/31  brain]
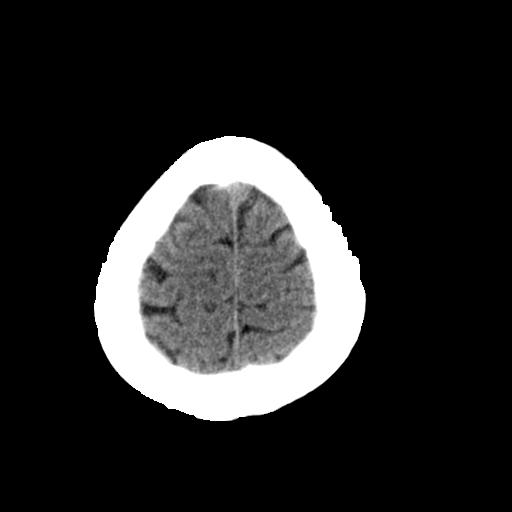
[im 28/31  brain]
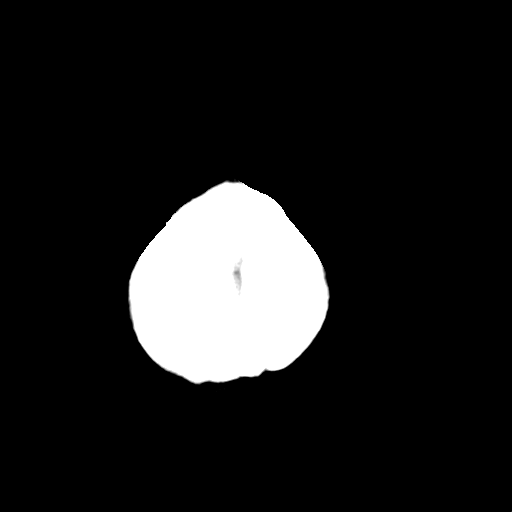
[im 28/31  bone]
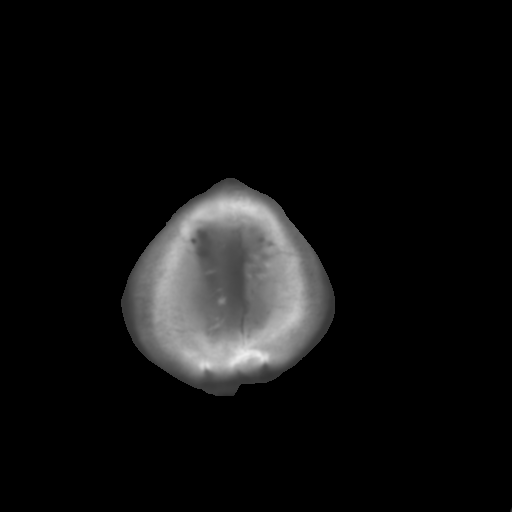

[Series 3: bone · axial · 0.39mm/px · z∈[+458,+498]mm · 3 of 31 slices shown]
[im 3/31  bone]
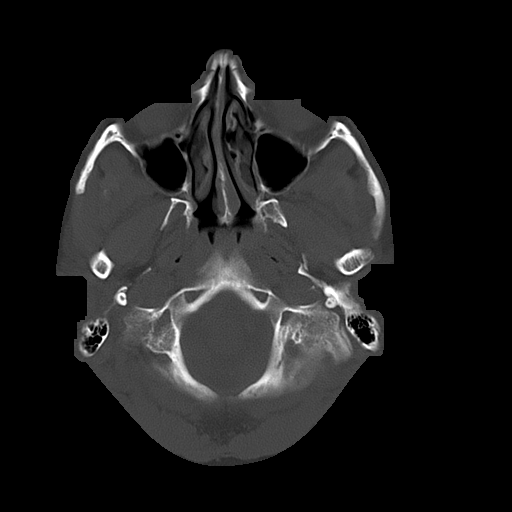
[im 7/31  bone]
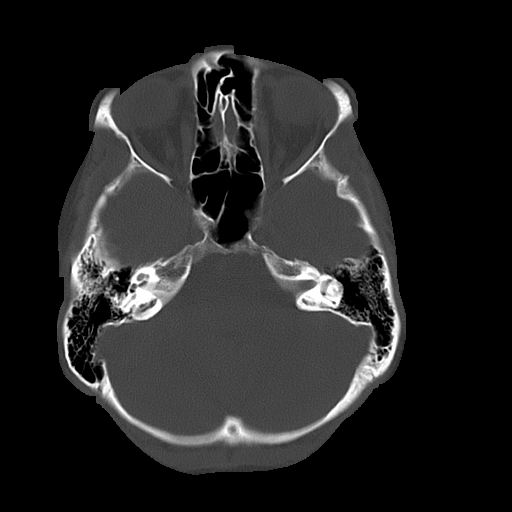
[im 11/31  bone]
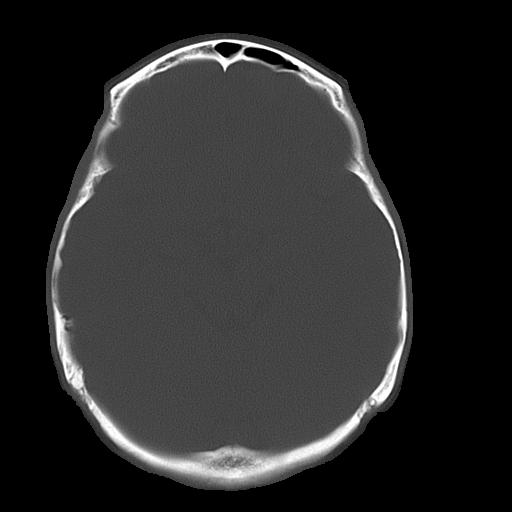

[16 of 30 positions shown; findings below may reference images not displayed]

FINDINGS: Standard nonenhanced CT obtained. No mass. No hydrocephalus. No
hemorrhage. Mild mucosal thickening ethmoid sinuses. Sinuses otherwise
clear. Orbits unremarkable. Mastoids clear.
IMPRESSION: No acute abnormality.

## 2015-01-23 ENCOUNTER — Encounter (HOSPITAL_COMMUNITY): Payer: Self-pay | Admitting: Emergency Medicine

## 2015-01-23 ENCOUNTER — Emergency Department (HOSPITAL_COMMUNITY)
Admission: EM | Admit: 2015-01-23 | Discharge: 2015-01-23 | Disposition: A | Discharge status: Home / Self Care | Payer: 59 | Attending: Emergency Medicine | Admitting: Emergency Medicine

## 2015-01-23 DIAGNOSIS — R569 Unspecified convulsions: Secondary | ICD-10-CM | POA: Diagnosis not present

## 2015-01-23 DIAGNOSIS — M791 Myalgia: Secondary | ICD-10-CM | POA: Diagnosis not present

## 2015-01-23 DIAGNOSIS — Z720 Tobacco use: Secondary | ICD-10-CM | POA: Diagnosis not present

## 2015-01-23 DIAGNOSIS — F1410 Cocaine abuse, uncomplicated: Secondary | ICD-10-CM | POA: Diagnosis not present

## 2015-01-23 DIAGNOSIS — F1310 Sedative, hypnotic or anxiolytic abuse, uncomplicated: Secondary | ICD-10-CM | POA: Insufficient documentation

## 2015-01-23 DIAGNOSIS — F1210 Cannabis abuse, uncomplicated: Secondary | ICD-10-CM | POA: Diagnosis not present

## 2015-01-23 DIAGNOSIS — Z3202 Encounter for pregnancy test, result negative: Secondary | ICD-10-CM | POA: Insufficient documentation

## 2015-01-23 DIAGNOSIS — Z79899 Other long term (current) drug therapy: Secondary | ICD-10-CM | POA: Diagnosis not present

## 2015-01-23 DIAGNOSIS — F1910 Other psychoactive substance abuse, uncomplicated: Secondary | ICD-10-CM

## 2015-01-23 LAB — URINALYSIS, ROUTINE W REFLEX MICROSCOPIC
BILIRUBIN URINE: NEGATIVE
GLUCOSE, UA: NEGATIVE mg/dL
Hgb urine dipstick: NEGATIVE
KETONES UR: 15 mg/dL — AB
LEUKOCYTES UA: NEGATIVE
Nitrite: POSITIVE — AB
Protein, ur: 100 mg/dL — AB
Specific Gravity, Urine: 1.026 (ref 1.005–1.030)
Urobilinogen, UA: 0.2 mg/dL (ref 0.0–1.0)
pH: 5.5 (ref 5.0–8.0)

## 2015-01-23 LAB — BASIC METABOLIC PANEL
Anion gap: 14 (ref 5–15)
BUN: 8 mg/dL (ref 6–23)
CALCIUM: 9.3 mg/dL (ref 8.4–10.5)
CO2: 19 mmol/L (ref 19–32)
CREATININE: 0.7 mg/dL (ref 0.50–1.10)
Chloride: 107 mmol/L (ref 96–112)
GFR calc Af Amer: >90 mL/min (ref >90)
Glucose, Bld: 90 mg/dL (ref 70–99)
Potassium: 3.8 mmol/L (ref 3.5–5.1)
Sodium: 140 mmol/L (ref 135–145)

## 2015-01-23 LAB — RAPID URINE DRUG SCREEN, HOSP PERFORMED
AMPHETAMINES: NOT DETECTED
BARBITURATES: NOT DETECTED
Benzodiazepines: POSITIVE — AB
Cocaine: POSITIVE — AB
OPIATES: NOT DETECTED
Tetrahydrocannabinol: POSITIVE — AB

## 2015-01-23 LAB — URINE MICROSCOPIC-ADD ON

## 2015-01-23 LAB — PREGNANCY, URINE: PREG TEST UR: NEGATIVE

## 2015-01-23 MED ORDER — LORAZEPAM 2 MG/ML IJ SOLN
1.0000 mg | Freq: Once | INTRAMUSCULAR | Status: AC
  Administered 2015-01-23: 1 mg via INTRAVENOUS
  Filled 2015-01-23: qty 1

## 2015-01-23 MED ORDER — SODIUM CHLORIDE 0.9 % IV BOLUS (SEPSIS)
1000.0000 mL | Freq: Once | INTRAVENOUS | Status: AC
  Administered 2015-01-23: 1000 mL via INTRAVENOUS

## 2015-01-23 MED ORDER — LEVETIRACETAM 500 MG PO TABS: 500.0000 mg | ORAL_TABLET | Freq: Two times a day (BID) | ORAL | Status: DC

## 2015-01-23 MED ORDER — KETOROLAC TROMETHAMINE 30 MG/ML IJ SOLN
30.0000 mg | Freq: Once | INTRAMUSCULAR | Status: AC
  Administered 2015-01-23: 30 mg via INTRAVENOUS
  Filled 2015-01-23: qty 1

## 2015-01-23 MED ORDER — LEVETIRACETAM 500 MG PO TABS
500.0000 mg | ORAL_TABLET | Freq: Once | ORAL | Status: AC
  Administered 2015-01-23: 500 mg via ORAL
  Filled 2015-01-23: qty 1

## 2015-01-23 NOTE — ED Notes (Addendum)
Patient here after having two seizures today.  Patient does not have a seizure disorder diagnosis.  She had one seizure 2 years ago.  Patient is CAOx4 at this time.  Complaining of muscle soreness to back and legs and frontal headache.

## 2015-01-23 NOTE — Discharge Instructions (Signed)
Epilepsy °Epilepsy is a disorder in which a person has repeated seizures over time. A seizure is a release of abnormal electrical activity in the brain. Seizures can cause a change in attention, behavior, or the ability to remain awake and alert (altered mental status). Seizures often involve uncontrollable shaking (convulsions).  °Most people with epilepsy lead normal lives. However, people with epilepsy are at an increased risk of falls, accidents, and injuries. Therefore, it is important to begin treatment right away. °CAUSES  °Epilepsy has many possible causes. Anything that disturbs the normal pattern of brain cell activity can lead to seizures. This may include:  °· Head injury. °· Birth trauma. °· High fever as a child. °· Stroke. °· Bleeding into or around the brain. °· Certain drugs. °· Prolonged low oxygen, such as what occurs after CPR efforts. °· Abnormal brain development. °· Certain illnesses, such as meningitis, encephalitis (brain infection), malaria, and other infections. °· An imbalance of nerve signaling chemicals (neurotransmitters).   °SIGNS AND SYMPTOMS  °The symptoms of a seizure can vary greatly from one person to another. Right before a seizure, you may have a warning (aura) that a seizure is about to occur. An aura may include the following symptoms: °· Fear or anxiety. °· Nausea. °· Feeling like the room is spinning (vertigo). °· Vision changes, such as seeing flashing lights or spots. °Common symptoms during a seizure include: °· Abnormal sensations, such as an abnormal smell or a bitter taste in the mouth.   °· Sudden, general body stiffness.   °· Convulsions that involve rhythmic jerking of the face, arm, or leg on one or both sides.   °· Sudden change in consciousness.   °· Appearing to be awake but not responding.   °· Appearing to be asleep but cannot be awakened.   °· Grimacing, chewing, lip smacking, drooling, tongue biting, or loss of bowel or bladder control. °After a seizure,  you may feel sleepy for a while.  °DIAGNOSIS  °Your health care provider will ask about your symptoms and take a medical history. Descriptions from any witnesses to your seizures will be very helpful in the diagnosis. A physical exam, including a detailed neurological exam, is necessary. Various tests may be done, such as:  °· An electroencephalogram (EEG). This is a painless test of your brain waves. In this test, a diagram is created of your brain waves. These diagrams can be interpreted by a specialist. °· An MRI of the brain.   °· A CT scan of the brain.   °· A spinal tap (lumbar puncture, LP). °· Blood tests to check for signs of infection or abnormal blood chemistry. °TREATMENT  °There is no cure for epilepsy, but it is generally treatable. Once epilepsy is diagnosed, it is important to begin treatment as soon as possible. For most people with epilepsy, seizures can be controlled with medicines. The following may also be used: °· A pacemaker for the brain (vagus nerve stimulator) can be used for people with seizures that are not well controlled by medicine. °· Surgery on the brain. °For some people, epilepsy eventually goes away. °HOME CARE INSTRUCTIONS  °· Follow your health care provider's recommendations on driving and safety in normal activities. °· Get enough rest. Lack of sleep can cause seizures. °· Only take over-the-counter or prescription medicines as directed by your health care provider. Take any prescribed medicine exactly as directed. °· Avoid any known triggers of your seizures. °· Keep a seizure diary. Record what you recall about any seizure, especially any possible trigger.   °· Make   sure the people you live and work with know that you are prone to seizures. They should receive instructions on how to help you. In general, a witness to a seizure should:   °¨ Cushion your head and body.   °¨ Turn you on your side.   °¨ Avoid unnecessarily restraining you.   °¨ Not place anything inside your  mouth.   °¨ Call for emergency medical help if there is any question about what has occurred.   °· Follow up with your health care provider as directed. You may need regular blood tests to monitor the levels of your medicine.   °SEEK MEDICAL CARE IF:  °· You develop signs of infection or other illness. This might increase the risk of a seizure.   °· You seem to be having more frequent seizures.   °· Your seizure pattern is changing.   °SEEK IMMEDIATE MEDICAL CARE IF:  °· You have a seizure that does not stop after a few moments.   °· You have a seizure that causes any difficulty in breathing.   °· You have a seizure that results in a very severe headache.   °· You have a seizure that leaves you with the inability to speak or use a part of your body.   °Document Released: 10/04/2005 Document Revised: 07/25/2013 Document Reviewed: 05/16/2013 °ExitCare® Patient Information ©2015 [employer]. This information is not intended to replace advice given to you by your health care provider. Make sure you discuss any questions you have with your health care provider. °Chemical Dependency °Chemical dependency is an addiction to drugs or alcohol. It is characterized by the repeated behavior of seeking out and using drugs and alcohol despite harmful consequences to the health and safety of ones self and others.  °RISK FACTORS °There are certain situations or behaviors that increase a person's risk for chemical dependency. These include: °· A family history of chemical dependency. °· A history of mental health issues, including depression and anxiety. °· A home environment where drugs and alcohol are easily available to you. °· Drug or alcohol use at a young age. °SYMPTOMS  °The following symptoms can indicate chemical dependency: °· Inability to limit the use of drugs or alcohol. °· Nausea, sweating, shakiness, and anxiety that occurs when alcohol or drugs are not being used. °· An increase in amount of drugs or alcohol that is  necessary to get drunk or high. °People who experience these symptoms can assess their use of drugs and alcohol by asking themselves the following questions: °· Have you been told by friends or family that they are worried about your use of alcohol or drugs? °· Do friends and family ever tell you about things you did while drinking alcohol or using drugs that you do not remember? °· Do you lie about using alcohol or drugs or about the amounts you use? °· Do you have difficulty completing daily tasks unless you use alcohol or drugs? °· Is the level of your work or school performance lower because of your drug or alcohol use? °· Do you get sick from using drugs or alcohol but keep using anyway? °· Do you feel uncomfortable in social situations unless you use alcohol or drugs? °· Do you use drugs or alcohol to help forget problems?  °An answer of yes to any of these questions may indicate chemical dependency. Professional evaluation is suggested. °Document Released: 09/28/2001 Document Revised: 12/27/2011 Document Reviewed: 12/10/2010 °ExitCare® Patient Information ©2015 [employer]. This information is not intended to replace advice given to you by your health care provider. Make   Make sure you discuss any questions you have with your health care provider. ° °

## 2015-01-23 NOTE — ED Provider Notes (Signed)
CSN: [id number]     Arrival date & time 01/23/15  1958 History   First MD Initiated Contact with Patient 01/23/15 2005     Chief Complaint  Patient presents with  . Seizures     (Consider location/radiation/quality/duration/timing/severity/associated sxs/prior Treatment) HPI Comments: She reports 2 witnessed seizures this afternoon, the last one 1.5 hours PTA. She currently feels tired with generalized muscle aches. No vomiting. She does not have any memory of the events and is reporting what witnesses had reported to her. No injury. She states she drank a lot of alcohol last night and did not sleep and feels this contributed to symptoms.   Patient is a 24 y.o. female presenting with seizures. The history is provided by the patient. No language interpreter was used.  Seizures Seizure activity on arrival: no   Seizure type:  Grand mal Episode characteristics: generalized shaking   Return to baseline: yes   Severity:  Moderate Duration:  1 minute Number of seizures this episode:  2 Recent head injury:  No recent head injuries PTA treatment:  None Seizures: History seizure x 1, 2 years ago.     History reviewed. No pertinent past medical history. History reviewed. No pertinent past surgical history. No family history on file. History  Substance Use Topics  . Smoking status: Current Every Day Smoker  . Smokeless tobacco: Not on file  . Alcohol Use: Yes   OB History    No data available     Review of Systems  Constitutional: Negative for fever and chills.  HENT: Negative.   Respiratory: Negative.   Cardiovascular: Negative.   Gastrointestinal: Negative.   Musculoskeletal: Positive for myalgias.  Skin: Negative.   Neurological: Positive for seizures.      Allergies  Aspirin and Tylenol  Home Medications   Prior to Admission medications   Medication Sig Start Date End Date Taking? Authorizing Provider  ibuprofen (ADVIL,MOTRIN) 200 MG tablet Take 400 mg by mouth  every 6 (six) hours as needed. For pain   Yes Historical Provider, MD  SUBOXONE 8-2 MG FILM Take 1 Film by mouth daily. 01/02/15  Yes Historical Provider, MD  traZODone (DESYREL) 50 MG tablet Take 50 mg by mouth daily. 12/05/14  Yes Historical Provider, MD   BP 109/68 mmHg  Pulse 71  Temp(Src) 99.1 F (37.3 C) (Oral)  Resp 23  SpO2 99% Physical Exam  Constitutional: She is oriented to person, place, and time. She appears well-developed and well-nourished.  HENT:  Head: Normocephalic and atraumatic.  Eyes: Conjunctivae are normal. No scleral icterus.  Neck: Normal range of motion. Neck supple.  Cardiovascular: Normal rate and regular rhythm.   Pulmonary/Chest: Effort normal and breath sounds normal.  Abdominal: Soft. Bowel sounds are normal. There is no tenderness. There is no rebound and no guarding.  Musculoskeletal: Normal range of motion.  Neurological: She is alert and oriented to person, place, and time. Coordination normal.  Skin: Skin is warm and dry. No rash noted.  Psychiatric: She has a normal mood and affect.    ED Course  Procedures (including critical care time) Labs Review Labs Reviewed  BASIC METABOLIC PANEL  PREGNANCY, URINE  URINALYSIS, ROUTINE W REFLEX MICROSCOPIC  URINE RAPID DRUG SCREEN (HOSP PERFORMED)   Results for orders placed or performed during the hospital encounter of 01/23/15  Basic metabolic panel  Result Value Ref Range   Sodium 140 135 - 145 mmol/L   Potassium 3.8 3.5 - 5.1 mmol/L   Chloride 107 96 -  112 mmol/L   CO2 19 19 - 32 mmol/L   Glucose, Bld 90 70 - 99 mg/dL   BUN 8 6 - 23 mg/dL   Creatinine, Ser 1.610.70 0.50 - 1.10 mg/dL   Calcium 9.3 8.4 - 09.610.5 mg/dL   GFR calc non Af Amer >90 >90 mL/min   GFR calc Af Amer >90 >90 mL/min   Anion gap 14 5 - 15  Pregnancy, urine  Result Value Ref Range   Preg Test, Ur NEGATIVE NEGATIVE  Urinalysis, Routine w reflex microscopic  Result Value Ref Range   Color, Urine YELLOW YELLOW   APPearance  CLOUDY (A) CLEAR   Specific Gravity, Urine 1.026 1.005 - 1.030   pH 5.5 5.0 - 8.0   Glucose, UA NEGATIVE NEGATIVE mg/dL   Hgb urine dipstick NEGATIVE NEGATIVE   Bilirubin Urine NEGATIVE NEGATIVE   Ketones, ur 15 (A) NEGATIVE mg/dL   Protein, ur 045100 (A) NEGATIVE mg/dL   Urobilinogen, UA 0.2 0.0 - 1.0 mg/dL   Nitrite POSITIVE (A) NEGATIVE   Leukocytes, UA NEGATIVE NEGATIVE  Urine rapid drug screen (hosp performed)  Result Value Ref Range   Opiates NONE DETECTED NONE DETECTED   Cocaine POSITIVE (A) NONE DETECTED   Benzodiazepines POSITIVE (A) NONE DETECTED   Amphetamines NONE DETECTED NONE DETECTED   Tetrahydrocannabinol POSITIVE (A) NONE DETECTED   Barbiturates NONE DETECTED NONE DETECTED  Urine microscopic-add on  Result Value Ref Range   Squamous Epithelial / LPF RARE RARE   Bacteria, UA FEW (A) RARE   Urine-Other MUCOUS PRESENT     Imaging Review No results found.   EKG Interpretation None      MDM   Final diagnoses:  None    1. Seizures 2. Polysubstance abuse  No seizure activity in ED. Patient monitored for over 2 hours. Ativan given here. Re-evaluations find patient sleeping, VSS. Parent at bedside. Given multiple seizures today with remote history, will start on Keppra, and provide neurology referral.     Elpidio AnisShari Jeweline Reif, PA-C 01/23/15 2257  Benjiman CoreNathan Pickering, MD 01/23/15 [phone]

## 2016-01-29 ENCOUNTER — Emergency Department (HOSPITAL_COMMUNITY)
Admission: EM | Admit: 2016-01-29 | Discharge: 2016-01-29 | Disposition: A | Discharge status: Home / Self Care | Payer: Managed Care, Other (non HMO) | Attending: Emergency Medicine | Admitting: Emergency Medicine

## 2016-01-29 ENCOUNTER — Encounter (HOSPITAL_COMMUNITY): Payer: Self-pay

## 2016-01-29 DIAGNOSIS — Z79899 Other long term (current) drug therapy: Secondary | ICD-10-CM | POA: Insufficient documentation

## 2016-01-29 DIAGNOSIS — F1910 Other psychoactive substance abuse, uncomplicated: Secondary | ICD-10-CM

## 2016-01-29 DIAGNOSIS — F1310 Sedative, hypnotic or anxiolytic abuse, uncomplicated: Secondary | ICD-10-CM | POA: Diagnosis present

## 2016-01-29 DIAGNOSIS — F17200 Nicotine dependence, unspecified, uncomplicated: Secondary | ICD-10-CM | POA: Insufficient documentation

## 2016-01-29 MED ORDER — ACETAMINOPHEN 325 MG PO TABS
650.0000 mg | ORAL_TABLET | Freq: Once | ORAL | Status: AC
  Administered 2016-01-29: 650 mg via ORAL
  Filled 2016-01-29: qty 2

## 2016-01-29 MED ORDER — CHLORDIAZEPOXIDE HCL 25 MG PO CAPS: ORAL_CAPSULE | ORAL | Status: DC

## 2016-01-29 NOTE — ED Provider Notes (Signed)
CSN: [id number]     Arrival date & time 01/29/16  1857 History   First MD Initiated Contact with Patient 01/29/16 2021     Chief Complaint  Patient presents with  . Withdrawal     (Consider location/radiation/quality/duration/timing/severity/associated sxs/prior Treatment) HPI Comments: Patient here requesting help for withdrawal for polysubstance abuse. Patient uses Suboxone but hasn't had it for over 8 days. She has had some myalgias but denies any abdominal pain but has had some loose stools. She also abuses benzodiazepines and last took a dose this morning. He denies any prior history of seizures from benzo withdrawal. Has been trying to do outpatient therapy and does not have any desire to go inpatient at this time. Denies any suicidal or homicidal ideations.  The history is provided by the patient.    History reviewed. No pertinent past medical history. History reviewed. No pertinent past surgical history. History reviewed. No pertinent family history. Social History  Substance Use Topics  . Smoking status: Current Every Day Smoker  . Smokeless tobacco: None  . Alcohol Use: Yes   OB History    No data available     Review of Systems  All other systems reviewed and are negative.     Allergies  Aspirin and Tylenol  Home Medications   Prior to Admission medications   Medication Sig Start Date End Date Taking? Authorizing Provider  ibuprofen (ADVIL,MOTRIN) 200 MG tablet Take 400 mg by mouth every 6 (six) hours as needed. For pain    Historical Provider, MD  levETIRAcetam (KEPPRA) 500 MG tablet Take 1 tablet (500 mg total) by mouth 2 (two) times daily. 01/23/15   Shari Upstill, PA-C  SUBOXONE 8-2 MG FILM Take 1 Film by mouth daily. 01/02/15   Historical Provider, MD  traZODone (DESYREL) 50 MG tablet Take 50 mg by mouth daily. 12/05/14   Historical Provider, MD   BP 125/79 mmHg  Pulse 73  Temp(Src) 98 F (36.7 C) (Oral)  Resp 20  SpO2 100% Physical Exam   Constitutional: She is oriented to person, place, and time. She appears well-developed and well-nourished.  Non-toxic appearance. No distress.  HENT:  Head: Normocephalic and atraumatic.  Eyes: Conjunctivae, EOM and lids are normal. Pupils are equal, round, and reactive to light.  Neck: Normal range of motion. Neck supple. No tracheal deviation present. No thyroid mass present.  Cardiovascular: Normal rate, regular rhythm and normal heart sounds.  Exam reveals no gallop.   No murmur heard. Pulmonary/Chest: Effort normal and breath sounds normal. No stridor. No respiratory distress. She has no decreased breath sounds. She has no wheezes. She has no rhonchi. She has no rales.  Abdominal: Soft. Normal appearance and bowel sounds are normal. She exhibits no distension. There is no tenderness. There is no rebound and no CVA tenderness.  Musculoskeletal: Normal range of motion. She exhibits no edema or tenderness.  Neurological: She is alert and oriented to person, place, and time. She has normal strength. No cranial nerve deficit or sensory deficit. GCS eye subscore is 4. GCS verbal subscore is 5. GCS motor subscore is 6.  Skin: Skin is warm and dry. No abrasion and no rash noted.  Psychiatric: She has a normal mood and affect. Her speech is normal and behavior is normal. She expresses no suicidal plans and no homicidal plans.  Nursing note and vitals reviewed.   ED Course  Procedures (including critical care time) Labs Review Labs Reviewed  COMPREHENSIVE METABOLIC PANEL  ETHANOL  CBC  URINE  RAPID DRUG SCREEN, HOSP PERFORMED    Imaging Review No results found. I have personally reviewed and evaluated these images and lab results as part of my medical decision-making.   EKG Interpretation None      MDM   Final diagnoses:  None    Patient without tremors at this time. She does not have any acute psychiatric condition. Will give patient taper of Librium and referrals    Lorre Nick, MD 01/29/16 2052

## 2016-01-29 NOTE — Discharge Instructions (Signed)
Benzodiazepine Withdrawal  °Benzodiazepines are a group of drugs that are prescribed for both short-term and long-term treatment of a variety of medical conditions. For some of these conditions, such as seizures and sudden and severe muscle spasms, they are used only for a few hours or a few days. For other conditions, such as anxiety, sleep problems, or frequent muscle spasms or to help prevent seizures, they are used for an extended period, usually weeks or months. °Benzodiazepines work by changing the way your brain functions. Normally, chemicals in your brain called neurotransmitters send messages between your brain cells. The neurotransmitter that benzodiazepines affect is called gamma-aminobutyric acid (GABA). GABA sends out messages that have a calming effect on many of the functions of your brain. Benzodiazepines make these messages stronger and increase this calming effect. °Short-term use of benzodiazepines usually does not cause problems when you stop taking the drugs. However, if you take benzodiazepines for a long time, your body can adjust to the drug and require more of it to produce the same effect (drug tolerance). Eventually, you can develop physical dependence on benzodiazepines, which is when you experience negative effects if your dosage of benzodiazepines is reduced or stopped too quickly. These negative effects are called symptoms of withdrawal. °SYMPTOMS °Symptoms of withdrawal may begin anytime within the first 10 days after you stop taking the benzodiazepine. They can last from several weeks up to a few months but usually are the worst between the first 10 to 14 days.  °The actual symptoms also vary, depending on the type of benzodiazepine you take. Possible symptoms include: °· Anxiety. °· Excitability. °· Irritability. °· Depression. °· Mood swings. °· Trouble sleeping. °· Confusion. °· Uncontrollable shaking (tremors). °· Muscle weakness. °· Seizures. °DIAGNOSIS °To diagnose  benzodiazepine withdrawal, your caregiver will examine you for certain signs, such as: °· Rapid heartbeat. °· Rapid breathing. °· Tremors. °· High blood pressure. °· Fever. °· Mood changes. °Your caregiver also may ask the following questions about your use of benzodiazepines: °· What type of benzodiazepine did you take? °· How much did you take each day? °· How long did you take the drug? °· When was the last time you took the drug? °· Do you take any other drugs? °· Have you had alcohol recently? °· Have you had a seizure recently? °· Have you lost consciousness recently? °· Have you had trouble remembering recent events? °· Have you had a recent increase in anxiety, irritability, or trouble sleeping? °A drug test also may be administered. °TREATMENT °The treatment for benzodiazepine withdrawal can vary, depending on the type and severity of your symptoms, what type of benzodiazepine you have been taking, and how long you have been taking the benzodiazepine. Sometimes it is necessary for you to be treated in a hospital, especially if you are at risk of seizures.  °Often, treatment includes a prescription for a long-acting benzodiazepine, the dosage of which is reduced slowly over a long period. This period could be several weeks or months. Eventually, your dosage will be reduced to a point that you can stop taking the drug, without experiencing withdrawal symptoms. This is called tapered withdrawal. Occasionally, minor symptoms of withdrawal continue for a few days or weeks after you have completed a tapered withdrawal. °SEEK IMMEDIATE MEDICAL CARE IF: °· You have a seizure. °· You develop a craving for drugs or alcohol. °· You begin to experience symptoms of withdrawal during your tapered withdrawal. °· You become very confused. °· You lose consciousness. °· You   have trouble breathing.  You think about hurting yourself or someone else.   This information is not intended to replace advice given to you by your  health care provider. Make sure you discuss any questions you have with your health care provider.   Document Released: 09/23/2011 Document Revised: 10/25/2014 Document Reviewed: 03/26/2015 Elsevier Interactive Patient Education 2016 [employer]. [employer] Guide Outpatient Counseling/Substance Abuse Adult The United Ways 211 is a great source of information about community services available.  Access by dialing 2-1-1 from anywhere in [place], or by website -  PooledIncome.pl.   Other Local Resources (Updated 10/2015)  Crisis Hotlines   Services     Area Served  [employer]  Crisis Hotline, available 24 hours a day, 7 days a week: 639-044-0404 [facility], [place]   [facility]  Crisis Hotline, available 24 hours a day, 7 days a week: 971-134-1529 [facility], [place]  [facility]  Suicide Prevention Hotline, available 24 hours a day, 7 days a week: (818)848-6316 [facility], [place]  [employer], available 24 hours a day, 7 days a week: (601)655-2481 [facility], [place]   [facility] Access to [employer], available 24 hours a day, 7 days a week: (938) 406-8722 All   [facility]  Crisis Hotline, available 24 hours a day, 7 days a week: 507 346 6012 All   Other Local Resources (Updated 10/2015)  Outpatient Counseling/ Substance Abuse Programs  Services     Address and Phone Number  ADS ([facility] Drug [facility])   Options include Individual counseling, group counseling, intensive outpatient program (several hours a day, several days a week)  Offers depression assessments  Provides methadone maintenance program [phone] [address] [place], [place] [address]   [facility]   Offers partial hospitalization/day treatment and DUI/DWI programs  [employer], private insurance [phone] [address], [address] [place], [place] [address]    [facility]    Services include intensive outpatient program (several hours a day, several days a week), outpatient treatment, DUI/DWI services, family education  Also has some services specifically for [employer] transitional housing  [phone] [address] [place], [place] [address]     [facility]  [employer], private pay, and private insurance [phone] [address] [place], [place] [address]  [employer] [facility]  Services include individual counseling, substance abuse intensive outpatient program (several hours a day, several days a week), day treatment  Delene Loll, Medicaid, private insurance [phone] [address], [address] [place], [place] [address]  [facility]   Offers substance abuse intensive outpatient program (several hours a day, several days a week), partial hospitalization program [phone] [address] [place], [place] [address]  [phone] [address] [place], [place] [address]  [phone] [address] [place], [place] [address]  [phone] [id number] [place], [place] [address]  [facility]  Individual counseling only  Accepts private insurance only [phone] [address] [place], [place] [address]  [facility]  Methadone maintenance program [phone] [address] [place], [place] [address]  [facility]  Walk-In Clinic providing substance abuse and mental health counseling  Accepts Medicaid, Medicare, private insurance  Offers sliding scale for uninsured [phone] [address] [place], [place]   [facility] [place], [employer].  Offers individual counseling, and intensive in-home services [phone] [address], [employer]  [address] [place], [place] [address]  [facility]  Offers individual counseling, family counseling,  group therapy, domestic violence counseling, consumer credit counseling  Accepts Medicare, Medicaid, private insurance  Offers sliding scale for uninsured [phone] [address] [place], [place] [address]  [phone] [facility], [address] [place], [place] [address]  [facility]  Offers individual, family and group counseling  3 locations - [place], [place], and [place]  [phone]  [address] [place], [place] [address]  [address] [place], [place] [address]  [address] [place], [place] [address]  [facility]    Offers psychiatric assessment, 8-week Intensive Outpatient Program (several hours a day, several times a week, daytime or evenings), early recovery group, family Program, medication management  Private pay or private insurance only [phone], or  [phone] [address] [place], [place] [address]  [facility]  Offers individual, couples and family counseling  Accepts Medicaid, private insurance, and sliding scale for uninsured [phone] [address] [place], [place] [address]  Len Blalockavid Fuller, MD  Individual counseling  Private insurance [phone] [address] [place], [place] [address]  [facility]   Offers assessment, substance abuse treatment, and behavioral health treatment [phone] [address] [place], [place] [address]  [facility]  Individual counseling  Accepts private insurance [phone] [address] [place], [place] [address]  [facility]  Individual counseling  Delene Lollccepts Medicare, private insurance [phone] [address] [place], [place] [address]  [facility]    Offers intensive outpatient program (several hours a day, several times a week)  Private pay, private insurance [phone] [facility] [address] [facility] [place], [place]  [facility]  Individual  counseling  Medicare, private insurance [phone] [address], [address] [place], [place] [address]  [facility]    Offers intensive outpatient program (several hours a day, several times a week) and partial hospitalization program [phone] [address] [place], [place] [address]  Emerson MonteParrish McKinney, MD  Individual counseling [phone] [address], [address] [place], [place] [address]  [facility]  Offers Christian counseling to individuals, couples, and families  Accepts Medicare and private insurance; offers sliding scale for uninsured [phone] [address] [place], [place] [address]  [facility]  [facility] [phone] [address], [address] [place], [place] [address]  [facility]   Offers crisis counseling, individual counseling, group therapy, in-home therapy, domestic violence services, day treatment, DWI services, Administrator, artsCommunity Support Team (CST), Assertive Community Treatment Team (ACTT), substance abuse Intensive Outpatient Program (several hours a day, several times a week)  2 locations - [place] and [place] [phone] [address] [place], [place] [address]  [phone] [address] [place], [place] [address]  [facility]     Individual counseling and group therapy  Accepts private insurance, [place]are, [place] [phone] [address] [place], [place]  [facility]  Offers individual and family counseling  Offers LGBTQ services  Accepts private insurance and private pay [phone] [address] [place], [place] [address]  [facility]    Offers individual counseling, group therapy, and outpatient detox  Accepts private insurance [phone] [address] [place], [place]  [facility]  Individual counseling  Accepts Medicare, private insurance  [phone] [address] [place], [place] [address]  [facility]  Individual counseling  [employer]s Medicare, private insurance [phone] [address] [place], [place]  [address]  [facility]   Offers substance abuse Intensive Outpatient Program (several hours a day, several times a week) [phone], or [phone] [address], [place]

## 2016-01-29 NOTE — ED Notes (Signed)
Pt is here for help withdrawing from suboxone, she states she has been on it for 5 years and now off it for 8 days, she's taking benzos and she's very anxious and tearful, she feels like she's going to go back in the streets if she doesn't get any help

## 2016-01-31 ENCOUNTER — Emergency Department (HOSPITAL_COMMUNITY)
Admission: EM | Admit: 2016-01-31 | Discharge: 2016-01-31 | Disposition: A | Discharge status: Home / Self Care | Payer: Managed Care, Other (non HMO) | Attending: Emergency Medicine | Admitting: Emergency Medicine

## 2016-01-31 ENCOUNTER — Encounter (HOSPITAL_COMMUNITY): Payer: Self-pay

## 2016-01-31 DIAGNOSIS — R451 Restlessness and agitation: Secondary | ICD-10-CM | POA: Diagnosis not present

## 2016-01-31 DIAGNOSIS — Z79899 Other long term (current) drug therapy: Secondary | ICD-10-CM | POA: Insufficient documentation

## 2016-01-31 DIAGNOSIS — G478 Other sleep disorders: Secondary | ICD-10-CM | POA: Diagnosis not present

## 2016-01-31 DIAGNOSIS — F419 Anxiety disorder, unspecified: Secondary | ICD-10-CM | POA: Diagnosis not present

## 2016-01-31 DIAGNOSIS — M791 Myalgia: Secondary | ICD-10-CM | POA: Diagnosis not present

## 2016-01-31 DIAGNOSIS — F1110 Opioid abuse, uncomplicated: Secondary | ICD-10-CM | POA: Insufficient documentation

## 2016-01-31 DIAGNOSIS — R51 Headache: Secondary | ICD-10-CM | POA: Diagnosis not present

## 2016-01-31 DIAGNOSIS — F17200 Nicotine dependence, unspecified, uncomplicated: Secondary | ICD-10-CM | POA: Diagnosis not present

## 2016-01-31 DIAGNOSIS — R630 Anorexia: Secondary | ICD-10-CM | POA: Diagnosis not present

## 2016-01-31 DIAGNOSIS — R110 Nausea: Secondary | ICD-10-CM | POA: Diagnosis not present

## 2016-01-31 DIAGNOSIS — F1910 Other psychoactive substance abuse, uncomplicated: Secondary | ICD-10-CM

## 2016-01-31 MED ORDER — CLONIDINE HCL 0.1 MG PO TABS: 0.1000 mg | ORAL_TABLET | Freq: Three times a day (TID) | ORAL | Status: DC

## 2016-01-31 MED ORDER — CLONIDINE HCL 0.1 MG PO TABS
0.1000 mg | ORAL_TABLET | Freq: Once | ORAL | Status: AC
  Administered 2016-01-31: 0.1 mg via ORAL
  Filled 2016-01-31: qty 1

## 2016-01-31 MED ORDER — ONDANSETRON 8 MG PO TBDP: 8.0000 mg | ORAL_TABLET | Freq: Three times a day (TID) | ORAL | Status: DC | PRN

## 2016-01-31 MED ORDER — ZOLPIDEM TARTRATE 10 MG PO TABS: 10.0000 mg | ORAL_TABLET | Freq: Every evening | ORAL | Status: DC | PRN

## 2016-01-31 MED ORDER — METHOCARBAMOL 750 MG PO TABS: 750.0000 mg | ORAL_TABLET | Freq: Four times a day (QID) | ORAL | Status: DC | PRN

## 2016-01-31 MED ORDER — ONDANSETRON 8 MG PO TBDP
8.0000 mg | ORAL_TABLET | Freq: Once | ORAL | Status: AC
Start: 2016-01-31 — End: 2016-01-31
  Administered 2016-01-31: 8 mg via ORAL
  Filled 2016-01-31: qty 1

## 2016-01-31 MED ORDER — METHOCARBAMOL 500 MG PO TABS
1000.0000 mg | ORAL_TABLET | Freq: Once | ORAL | Status: AC
  Administered 2016-01-31: 1000 mg via ORAL
  Filled 2016-01-31: qty 2

## 2016-01-31 NOTE — Discharge Instructions (Signed)
Take ibuprofen or naprosyn for pain. Take clonidine as prescribed to help with withraw symptoms. Take zofran as needed for nausea. Take robaxin for restless legs. Take ambien to help you sleep. Follow up with resources provided.   Opioid Withdrawal Opioids are a group of narcotic drugs. They include the street drug heroin. They also include pain medicines, such as morphine, hydrocodone, oxycodone, and fentanyl. Opioid withdrawal is a group of characteristic physical and mental signs and symptoms. It typically occurs if you have been using opioids daily for several weeks or longer and stop using or rapidly decrease use. Opioid withdrawal can also occur if you have used opioids daily for a long time and are given a medicine to block the effect.  SIGNS AND SYMPTOMS Opioid withdrawal includes three or more of the following symptoms:   Depressed, anxious, or irritable mood.  Nausea or vomiting.  Muscle aches or spasms.   Watery eyes.   Runny nose.  Dilated pupils, sweating, or hairs standing on end.  Diarrhea or intestinal cramping.  Yawning.   Fever.  Increased blood pressure.  Fast pulse.  Restlessness or trouble sleeping. These signs and symptoms occur within several hours of stopping or reducing short-acting opioids, such as heroin. They can occur within 3 days of stopping or reducing long-acting opioids, such as methadone. Withdrawal begins within minutes of receiving a drug that blocks the effects of opioids, such as naltrexone or naloxone. DIAGNOSIS  Opioid use disorder is diagnosed by your health care provider. You will be asked about your symptoms, drug and alcohol use, medical history, and use of medicines. A physical exam may be done. Lab tests may be ordered. Your health care provider may have you see a mental health professional.  TREATMENT  The treatment for opioid withdrawal is usually provided by medical doctors with special training in substance use disorders  (addiction specialists). The following medicines may be included in treatment:  Opioids given in place of the abused opioid. They turn on opioid receptors in the brain and lessen or prevent withdrawal symptoms. They are gradually decreased (opioid substitution and taper).  Non-opioids that can lessen certain opioid withdrawal symptoms. They may be used alone or with opioid substitution and taper. Successful long-term recovery usually requires medicine, counseling, and group support. HOME CARE INSTRUCTIONS   Take medicines only as directed by your health care provider.  Check with your health care provider before starting new medicines.  Keep all follow-up visits as directed by your health care provider. SEEK MEDICAL CARE IF:  You are not able to take your medicines as directed.  Your symptoms get worse.  You relapse. SEEK IMMEDIATE MEDICAL CARE IF:  You have serious thoughts about hurting yourself or others.  You have a seizure.  You lose consciousness.   This information is not intended to replace advice given to you by your health care provider. Make sure you discuss any questions you have with your health care provider.   Document Released: 10/07/2003 Document Revised: 10/25/2014 Document Reviewed: 10/17/2013 Elsevier Interactive Patient Education [employer]2016 [employer]vier Inc.

## 2016-01-31 NOTE — ED Notes (Signed)
Pt's significant other is taking pt home and to get her prescriptions. He will be staying with the pt tonight as well to help her follow up tomorrow according to the resource guide given to them at time of dc

## 2016-01-31 NOTE — ED Notes (Signed)
Pt presents with c/o detox from suboxone and benzos. Pt was seen for the same 2 days ago and given a rx for librium but has still been unable to sleep. Pt reports the last suboxone she took was 7-8 days ago and benzos was 2 days ago. Pt denies any SI or HI.

## 2016-02-01 NOTE — ED Provider Notes (Signed)
CSN: [id number]     Arrival date & time 01/31/16  1813 History   First MD Initiated Contact with Patient 01/31/16 1932     Chief Complaint  Patient presents with  . Detox       (Consider location/radiation/quality/duration/timing/severity/associated sxs/prior Treatment) HPI Amanda [name] is a 25 y.o. female presents to emergency department with complaint of detox from Suboxone. Patient states she has been using for several years. She states she tapered down, however she was taken off of that approximately week ago and she is still having some withdrawal symptoms. She states she is unable to use, sleep, states she has restless legs. She is tearful. She was seen here 2 days ago was given Librium. She states it is not helping. She has not tried calling any outpatient program is [facility] programs to see if she could be admitted there. She is requesting inpatient admission and treatment for her symptoms. She denies any alcohol. She is not taking other medications other than her trazodone and Librium at this time.  History reviewed. No pertinent past medical history. History reviewed. No pertinent past surgical history. No family history on file. Social History  Substance Use Topics  . Smoking status: Current Every Day Smoker  . Smokeless tobacco: None  . Alcohol Use: Yes   OB History    No data available     Review of Systems  Constitutional: Positive for appetite change. Negative for fever and chills.  Respiratory: Negative for cough, chest tightness and shortness of breath.   Cardiovascular: Negative for chest pain, palpitations and leg swelling.  Gastrointestinal: Positive for nausea. Negative for vomiting, abdominal pain and diarrhea.  Genitourinary: Negative for dysuria, flank pain and pelvic pain.  Musculoskeletal: Positive for myalgias and arthralgias. Negative for neck pain and neck stiffness.  Skin: Negative for rash.  Neurological: Positive for headaches. Negative for  dizziness and weakness.  Psychiatric/Behavioral: Positive for sleep disturbance and agitation. The patient is nervous/anxious.   All other systems reviewed and are negative.     Allergies  Aspirin and Tylenol  Home Medications   Prior to Admission medications   Medication Sig Start Date End Date Taking? Authorizing Provider  chlordiazePOXIDE (LIBRIUM) 25 MG capsule 50mg  PO TID x 1D, then 25-50mg  PO BID X 1D, then 25-50mg  PO QD X 1D 01/29/16   Lorre NickAnthony Allen, MD  cloNIDine (CATAPRES) 0.1 MG tablet Take 1 tablet (0.1 mg total) by mouth 3 (three) times daily. 01/31/16   Carmelia Tiner, PA-C  ibuprofen (ADVIL,MOTRIN) 200 MG tablet Take 400 mg by mouth every 6 (six) hours as needed. For pain    Historical Provider, MD  levETIRAcetam (KEPPRA) 500 MG tablet Take 1 tablet (500 mg total) by mouth 2 (two) times daily. 01/23/15   Elpidio AnisShari Upstill, PA-C  methocarbamol (ROBAXIN) 750 MG tablet Take 1 tablet (750 mg total) by mouth every 6 (six) hours as needed for muscle spasms. 01/31/16   Flay Ghosh, PA-C  ondansetron (ZOFRAN ODT) 8 MG disintegrating tablet Take 1 tablet (8 mg total) by mouth every 8 (eight) hours as needed for nausea or vomiting. 01/31/16   Zekiah Coen, PA-C  SUBOXONE 8-2 MG FILM Take 1 Film by mouth daily. 01/02/15   Historical Provider, MD  traZODone (DESYREL) 50 MG tablet Take 50 mg by mouth daily. 12/05/14   Historical Provider, MD  zolpidem (AMBIEN) 10 MG tablet Take 1 tablet (10 mg total) by mouth at bedtime as needed for sleep. 01/31/16   Jaynie Crumbleatyana Debrina Kizer, PA-C  BP 111/81 mmHg  Pulse 80  Temp(Src) 98.4 F (36.9 C) (Oral)  Resp 18  SpO2 100%  LMP 01/31/2016 (Approximate) Physical Exam  Constitutional: She is oriented to person, place, and time. She appears well-developed and well-nourished. No distress.  HENT:  Head: Normocephalic.  Eyes: Conjunctivae are normal.  Neck: Neck supple.  Cardiovascular: Normal rate, regular rhythm and normal heart sounds.    Pulmonary/Chest: Effort normal and breath sounds normal. No respiratory distress. She has no wheezes. She has no rales.  Abdominal: Soft. Bowel sounds are normal. She exhibits no distension. There is no tenderness. There is no rebound.  Musculoskeletal: She exhibits no edema.  Neurological: She is alert and oriented to person, place, and time.  Skin: Skin is warm and dry.  Psychiatric: She has a normal mood and affect. Her behavior is normal.  Patient is tearful, denies suicidal homicidal ideations  Nursing note and vitals reviewed.   ED Course  Procedures (including critical care time) Labs Review Labs Reviewed - No data to display  Imaging Review No results found. I have personally reviewed and evaluated these images and lab results as part of my medical decision-making.   EKG Interpretation None      MDM   Final diagnoses:  Polysubstance abuse    Patient is here with withdrawal from Suboxone and requesting inpatient treatment. Had this time she does not appear to be in any distress except for appears to be anxious. Her vital signs are normal. She is tearful. Explained to her we do not inpatient treatment for opiate withdrawals. I will however treat her symptoms. I will add Robaxin, clonidine, Ambien, Zofran to her prescriptions. She was again given resource guide to follow-up.  Filed Vitals:   01/31/16 2018 01/31/16 2019  BP: 111/81 111/81  Pulse:  80  Temp:    Resp:         Jaynie Crumble, PA-C 02/01/16 0150  Azalia Bilis, MD 02/01/16 2307

## 2016-03-14 ENCOUNTER — Encounter (HOSPITAL_COMMUNITY): Payer: Self-pay

## 2016-03-14 ENCOUNTER — Inpatient Hospital Stay (HOSPITAL_COMMUNITY)
Admission: AD | Admit: 2016-03-14 | Discharge: 2016-03-14 | Disposition: A | Discharge status: Home / Self Care | Payer: Managed Care, Other (non HMO) | Source: Ambulatory Visit | Attending: Family Medicine | Admitting: Family Medicine

## 2016-03-14 DIAGNOSIS — M549 Dorsalgia, unspecified: Secondary | ICD-10-CM | POA: Diagnosis present

## 2016-03-14 DIAGNOSIS — Z87442 Personal history of urinary calculi: Secondary | ICD-10-CM | POA: Diagnosis not present

## 2016-03-14 DIAGNOSIS — F17210 Nicotine dependence, cigarettes, uncomplicated: Secondary | ICD-10-CM | POA: Insufficient documentation

## 2016-03-14 DIAGNOSIS — Z888 Allergy status to other drugs, medicaments and biological substances status: Secondary | ICD-10-CM | POA: Insufficient documentation

## 2016-03-14 DIAGNOSIS — Z3202 Encounter for pregnancy test, result negative: Secondary | ICD-10-CM | POA: Diagnosis not present

## 2016-03-14 DIAGNOSIS — Z79899 Other long term (current) drug therapy: Secondary | ICD-10-CM | POA: Diagnosis not present

## 2016-03-14 DIAGNOSIS — N3000 Acute cystitis without hematuria: Secondary | ICD-10-CM | POA: Insufficient documentation

## 2016-03-14 HISTORY — DX: Unspecified ovarian cyst, unspecified side: N83.209

## 2016-03-14 HISTORY — DX: Urinary tract infection, site not specified: N39.0

## 2016-03-14 HISTORY — DX: Calculus of kidney: N20.0

## 2016-03-14 LAB — URINALYSIS, ROUTINE W REFLEX MICROSCOPIC
Bilirubin Urine: NEGATIVE
Glucose, UA: NEGATIVE mg/dL
Ketones, ur: NEGATIVE mg/dL
NITRITE: POSITIVE — AB
Protein, ur: NEGATIVE mg/dL
SPECIFIC GRAVITY, URINE: 1.015 (ref 1.005–1.030)
pH: 6 (ref 5.0–8.0)

## 2016-03-14 LAB — POCT PREGNANCY, URINE: Preg Test, Ur: NEGATIVE

## 2016-03-14 LAB — URINE MICROSCOPIC-ADD ON

## 2016-03-14 LAB — CBC
HEMATOCRIT: 39.8 % (ref 36.0–46.0)
Hemoglobin: 13.6 g/dL (ref 12.0–15.0)
MCH: 29.7 pg (ref 26.0–34.0)
MCHC: 34.2 g/dL (ref 30.0–36.0)
MCV: 86.9 fL (ref 78.0–100.0)
Platelets: 200 10*3/uL (ref 150–400)
RBC: 4.58 MIL/uL (ref 3.87–5.11)
RDW: 15.3 % (ref 11.5–15.5)
WBC: 6.9 10*3/uL (ref 4.0–10.5)

## 2016-03-14 LAB — HCG, QUANTITATIVE, PREGNANCY: hCG, Beta Chain, Quant, S: <1 m[IU]/mL (ref <5)

## 2016-03-14 MED ORDER — NAPROXEN 500 MG PO TABS
500.0000 mg | ORAL_TABLET | Freq: Once | ORAL | Status: AC
  Administered 2016-03-14: 500 mg via ORAL
  Filled 2016-03-14: qty 1

## 2016-03-14 MED ORDER — SULFAMETHOXAZOLE-TRIMETHOPRIM 800-160 MG PO TABS: 1.0000 | ORAL_TABLET | Freq: Two times a day (BID) | ORAL | Status: DC

## 2016-03-14 MED ORDER — NAPROXEN 375 MG PO TABS: 375.0000 mg | ORAL_TABLET | Freq: Two times a day (BID) | ORAL | Status: DC

## 2016-03-14 NOTE — Discharge Instructions (Signed)

## 2016-03-14 NOTE — MAU Provider Note (Signed)
History     CSN: [id number]  Arrival date and time: 03/14/16 1351   First Provider Initiated Contact with Patient 03/14/16 1444       Chief Complaint  Patient presents with  . Back Pain  . Abdominal Pain  . Possible Pregnancy   HPI  Amanda [name] is a 25 y.o. [phone] female who presents with abdominal pain & positive home pregnancy test. LMP 01/25/16. Reports hx of regular periods and no contraception. Had 2 faint +HPTs yesterday and the day before. Reports abdominal pain x 3 days. Worse in LLQ; describes as constant throbbing pain that radiates to her left lower back. Rates 5/10. Has not treated. Denies vaginal bleeding, vaginal discharge, dysuria, n/v/d, or constipation.    OB History    Gravida Para Term Preterm AB TAB SAB Ectopic Multiple Living   5 1 1  4 1 2 1  1       Past Medical History  Diagnosis Date  . Kidney stones   . Urinary tract infection   . Ovarian cyst     History reviewed. No pertinent past surgical history.  History reviewed. No pertinent family history.  Social History  Substance Use Topics  . Smoking status: Current Every Day Smoker -- 1.00 packs/day    Types: Cigarettes  . Smokeless tobacco: None  . Alcohol Use: Yes    Allergies:  Allergies  Allergen Reactions  . Aspirin Nausea And Vomiting  . Tylenol [Acetaminophen] Nausea And Vomiting    Prescriptions prior to admission  Medication Sig Dispense Refill Last Dose  . chlordiazePOXIDE (LIBRIUM) 25 MG capsule 50mg  PO TID x 1D, then 25-50mg  PO BID X 1D, then 25-50mg  PO QD X 1D 10 capsule 0   . cloNIDine (CATAPRES) 0.1 MG tablet Take 1 tablet (0.1 mg total) by mouth 3 (three) times daily. 15 tablet 0   . ibuprofen (ADVIL,MOTRIN) 200 MG tablet Take 400 mg by mouth every 6 (six) hours as needed. For pain   Past Month at Unknown time  . levETIRAcetam (KEPPRA) 500 MG tablet Take 1 tablet (500 mg total) by mouth 2 (two) times daily. 60 tablet 0   . methocarbamol (ROBAXIN) 750 MG tablet Take 1  tablet (750 mg total) by mouth every 6 (six) hours as needed for muscle spasms. 15 tablet 0   . ondansetron (ZOFRAN ODT) 8 MG disintegrating tablet Take 1 tablet (8 mg total) by mouth every 8 (eight) hours as needed for nausea or vomiting. 10 tablet 0   . SUBOXONE 8-2 MG FILM Take 1 Film by mouth daily.  0 past 2 weeks  . traZODone (DESYREL) 50 MG tablet Take 50 mg by mouth daily.  1 01/22/2015 at Unknown time  . zolpidem (AMBIEN) 10 MG tablet Take 1 tablet (10 mg total) by mouth at bedtime as needed for sleep. 5 tablet 0     Review of Systems  Constitutional: Negative.   Gastrointestinal: Positive for abdominal pain. Negative for nausea, vomiting, diarrhea and constipation.  Genitourinary: Negative for dysuria, frequency and hematuria.       No vaginal bleeding or discharge   Physical Exam   Blood pressure 111/78, pulse 97, temperature 98.1 F (36.7 C), temperature source Oral, resp. rate 18, height 5\' 5"  (1.651 m), weight 133 lb (60.328 kg), last menstrual period 01/25/2016.  Physical Exam  Nursing note and vitals reviewed. Constitutional: She is oriented to person, place, and time. She appears well-developed and well-nourished. No distress.  HENT:  Head: Normocephalic and atraumatic.  Eyes: Conjunctivae are normal. Right eye exhibits no discharge. Left eye exhibits no discharge. No scleral icterus.  Neck: Normal range of motion.  Cardiovascular: Normal rate, regular rhythm and normal heart sounds.   No murmur heard. Respiratory: Effort normal and breath sounds normal. No respiratory distress. She has no wheezes.  GI: Soft. Bowel sounds are normal. She exhibits no distension. There is tenderness in the left lower quadrant. There is no rigidity, no rebound, no guarding, no CVA tenderness and no tenderness at McBurney's point.  Genitourinary: Uterus normal. Cervix exhibits no motion tenderness. Right adnexum displays no mass, no tenderness and no fullness. Left adnexum displays no mass, no  tenderness and no fullness.  Neurological: She is alert and oriented to person, place, and time.  Skin: Skin is warm and dry. She is not diaphoretic.  Psychiatric: She has a normal mood and affect. Her behavior is normal. Judgment and thought content normal.    MAU Course  Procedures Results for orders placed or performed during the hospital encounter of 03/14/16 (from the past 24 hour(s))  Urinalysis, Routine w reflex microscopic (not at [facility])     Status: Abnormal   Collection Time: 03/14/16  2:20 PM  Result Value Ref Range   Color, Urine YELLOW YELLOW   APPearance CLOUDY (A) CLEAR   Specific Gravity, Urine 1.015 1.005 - 1.030   pH 6.0 5.0 - 8.0   Glucose, UA NEGATIVE NEGATIVE mg/dL   Hgb urine dipstick TRACE (A) NEGATIVE   Bilirubin Urine NEGATIVE NEGATIVE   Ketones, ur NEGATIVE NEGATIVE mg/dL   Protein, ur NEGATIVE NEGATIVE mg/dL   Nitrite POSITIVE (A) NEGATIVE   Leukocytes, UA SMALL (A) NEGATIVE  Urine microscopic-add on     Status: Abnormal   Collection Time: 03/14/16  2:20 PM  Result Value Ref Range   Squamous Epithelial / LPF 0-5 (A) NONE SEEN   WBC, UA 6-30 0 - 5 WBC/hpf   RBC / HPF 0-5 0 - 5 RBC/hpf   Bacteria, UA MANY (A) NONE SEEN  Pregnancy, urine POC     Status: None   Collection Time: 03/14/16  2:41 PM  Result Value Ref Range   Preg Test, Ur NEGATIVE NEGATIVE  hCG, quantitative, pregnancy     Status: None   Collection Time: 03/14/16  2:54 PM  Result Value Ref Range   hCG, Beta Chain, Quant, S <1 <5 mIU/mL  CBC     Status: None   Collection Time: 03/14/16  2:56 PM  Result Value Ref Range   WBC 6.9 4.0 - 10.5 K/uL   RBC 4.58 3.87 - 5.11 MIL/uL   Hemoglobin 13.6 12.0 - 15.0 g/dL   HCT 16.1 09.6 - 04.5 %   MCV 86.9 78.0 - 100.0 fL   MCH 29.7 26.0 - 34.0 pg   MCHC 34.2 30.0 - 36.0 g/dL   RDW 40.9 81.1 - 91.4 %   Platelets 200 150 - 400 K/uL    MDM UPT negative  -- BHCG ordered d/t +HPT BHCG <1 Bimanual exam normal Naproxen 500 mg PO  Assessment and  Plan  A: 1. Acute cystitis without hematuria   2. Negative pregnancy test    P: Discharge home Rx septra & naproxen Discussed reasons to return to MAU F/u with PCP as needed    Judeth Horn 03/14/2016, 2:43 PM

## 2016-03-14 NOTE — MAU Note (Signed)
Abdominal pain x 1 week, back pain, patient has 2 positive pregnancy tests with faint lines, LMP 01/25/16

## 2016-03-16 LAB — URINE CULTURE

## 2017-10-13 ENCOUNTER — Emergency Department
Admission: EM | Admit: 2017-10-13 | Discharge: 2017-10-13 | Disposition: A | Discharge status: Home / Self Care | Payer: Medicaid Other | Attending: Emergency Medicine | Admitting: Emergency Medicine

## 2017-10-13 ENCOUNTER — Encounter: Payer: Self-pay | Admitting: Emergency Medicine

## 2017-10-13 ENCOUNTER — Other Ambulatory Visit: Payer: Self-pay

## 2017-10-13 DIAGNOSIS — Z79899 Other long term (current) drug therapy: Secondary | ICD-10-CM | POA: Diagnosis not present

## 2017-10-13 DIAGNOSIS — F17210 Nicotine dependence, cigarettes, uncomplicated: Secondary | ICD-10-CM | POA: Insufficient documentation

## 2017-10-13 DIAGNOSIS — R2242 Localized swelling, mass and lump, left lower limb: Secondary | ICD-10-CM | POA: Diagnosis present

## 2017-10-13 DIAGNOSIS — L02419 Cutaneous abscess of limb, unspecified: Secondary | ICD-10-CM

## 2017-10-13 DIAGNOSIS — L02416 Cutaneous abscess of left lower limb: Secondary | ICD-10-CM | POA: Insufficient documentation

## 2017-10-13 MED ORDER — SULFAMETHOXAZOLE-TRIMETHOPRIM 800-160 MG PO TABS: 1.0000 | ORAL_TABLET | Freq: Two times a day (BID) | ORAL | 0 refills | Status: AC

## 2017-10-13 NOTE — ED Triage Notes (Signed)
Pt reports that she had an abscess on her left lower leg. She went to urgent care last week to have lanced open. She states that she done is with antibiotics states that it is still red and there is small amount of purulent drainage coming out. She is afraid that it may be infected.

## 2017-10-13 NOTE — ED Notes (Signed)
See triage note  Presents for wound check to left lower leg  States she may have gotten bitten by something  [facility]  Area was lanced and packed at that time  Placed on antibiotics   Redness has decreased but wound area is swollen and draining

## 2017-10-13 NOTE — ED Provider Notes (Signed)
[facility] Emergency Department Provider Note  ___________________________________________   First MD Initiated Contact with Patient 10/13/17 1335     (approximate)  I have reviewed the triage vital signs and the nursing notes.   HISTORY  Chief Complaint Abscess   HPI Amanda [name] is a 26 y.o. female is here with complaint of left leg open wound. She states that she was seen at an urgent care where an abscess was lanced, packed and antibiotics were started. Patient states she also received an injection of Rocephin and has taken 7 days of Keflex. Patient was instructed to remove the drain on the second day. She still continues to have some drainage and area has not healed.   Past Medical History:  Diagnosis Date  . Kidney stones   . Ovarian cyst   . Urinary tract infection     Patient Active Problem List   Diagnosis Date Noted  . TOBACCO ABUSE 09/04/2007    History reviewed. No pertinent surgical history.  Prior to Admission medications   Medication Sig Start Date End Date Taking? Authorizing Provider  Buprenorphine HCl-Naloxone HCl (SUBOXONE) 4-1 MG FILM Place 1 Film under the tongue daily.    [provider]  QUEtiapine (SEROQUEL) 200 MG tablet Take 200 mg by mouth at bedtime.    [provider]  sulfamethoxazole-trimethoprim (BACTRIM DS,SEPTRA DS) 800-160 MG tablet Take 1 tablet by mouth 2 (two) times daily for 10 days. 10/13/17 10/23/17  Tommi RumpsSummers, Ranulfo Kall L, PA-C    Allergies Patient has no known allergies.  No family history on file.  Social History Social History   Tobacco Use  . Smoking status: Current Every Day Smoker    Packs/day: 1.00    Types: Cigarettes  Substance Use Topics  . Alcohol use: Yes  . Drug use: Not on file    Review of Systems Constitutional: subjective fever once/chills Cardiovascular: Denies chest pain. Respiratory: Denies shortness of breath. Skin: positive for open  wound. Neurological: Negative for focal weakness or numbness. ____________________________________________   PHYSICAL EXAM:  VITAL SIGNS: ED Triage Vitals  Enc Vitals Group     BP 10/13/17 1311 113/70     Pulse Rate 10/13/17 1311 96     Resp 10/13/17 1311 16     Temp 10/13/17 1311 98.5 F (36.9 C)     Temp Source 10/13/17 1311 Oral     SpO2 10/13/17 1311 99 %     Weight 10/13/17 1313 123 lb (55.8 kg)     Height 10/13/17 1313 5\' 5"  (1.651 m)     Head Circumference --      Peak Flow --      Pain Score 10/13/17 1311 5     Pain Loc --      Pain Edu? --      Excl. in GC? --    Constitutional: Alert and oriented. Well appearing and in no acute distress. Eyes: Conjunctivae are normal.  Head: Atraumatic. Neck: No stridor.   Cardiovascular: Normal rate, regular rhythm. Grossly normal heart sounds.  Good peripheral circulation. Respiratory: Normal respiratory effort.  No retractions. Lungs CTAB. Musculoskeletal: moves upper and lower history is benign difficulty. Normal gait was noted. Neurologic:  Normal speech and language. No gross focal neurologic deficits are appreciated. Skin:  On the medial aspect of the left ankle there is an open wound with minimal active drainage. Area is tender to palpation. There is also a erythematous scattered papular rash around the wound. Patient has been using  triple antibiotic ointment to this area. Psychiatric: Mood and affect are normal. Speech and behavior are normal.  ____________________________________________   LABS (all labs ordered are listed, but only abnormal results are displayed)  Labs Reviewed - No data to display   PROCEDURES  Procedure(s) performed: None  Procedures  Critical Care performed: No  ____________________________________________   INITIAL IMPRESSION / ASSESSMENT AND PLAN / ED COURSE Patient was placed on Bactrim DS twice a day for 10 days. She is to also use warm compresses to the area frequently and  discontinue using the triple antibiotic as this may be a reaction to the neomycin. She is to elevate her extremity often. She'll return to the ED if any worsening of her symptoms such as fever, vomiting, worsening of her abscess.  ____________________________________________   FINAL CLINICAL IMPRESSION(S) / ED DIAGNOSES  Final diagnoses:  Abscess of ankle     ED Discharge Orders        Ordered    sulfamethoxazole-trimethoprim (BACTRIM DS,SEPTRA DS) 800-160 MG tablet  2 times daily     10/13/17 1352       Note:  This document was prepared using Dragon voice recognition software and may include unintentional dictation errors.    Tommi RumpsSummers, Carlus Stay L, PA-C 10/13/17 1514    Governor RooksLord, Rebecca, MD 10/14/17 [phone]

## 2017-10-13 NOTE — Discharge Instructions (Signed)
Use dry dressings to the area and discontinue using ointment.  Use warm compresses to the area frequently and elevate as needed for swelling. Begin taking Bactrim DS twice a day for 10 days. Return to the ED if any worsening of her symptoms such as fever, vomiting, worsening of your abscess.

## 2018-02-15 ENCOUNTER — Encounter: Payer: Self-pay | Admitting: Obstetrics and Gynecology

## 2018-02-15 ENCOUNTER — Ambulatory Visit: Payer: Self-pay | Admitting: Obstetrics and Gynecology

## 2018-03-15 ENCOUNTER — Ambulatory Visit (INDEPENDENT_AMBULATORY_CARE_PROVIDER_SITE_OTHER): Payer: Medicaid Other | Admitting: Obstetrics and Gynecology

## 2018-03-15 ENCOUNTER — Encounter: Payer: Self-pay | Admitting: Obstetrics and Gynecology

## 2018-03-15 VITALS — BP 100/60 | HR 96 | Ht 65.0 in | Wt 112.0 lb

## 2018-03-15 DIAGNOSIS — R6881 Early satiety: Secondary | ICD-10-CM

## 2018-03-15 DIAGNOSIS — Z1231 Encounter for screening mammogram for malignant neoplasm of breast: Secondary | ICD-10-CM | POA: Diagnosis not present

## 2018-03-15 DIAGNOSIS — N926 Irregular menstruation, unspecified: Secondary | ICD-10-CM

## 2018-03-15 DIAGNOSIS — Z3202 Encounter for pregnancy test, result negative: Secondary | ICD-10-CM | POA: Diagnosis not present

## 2018-03-15 DIAGNOSIS — N912 Amenorrhea, unspecified: Secondary | ICD-10-CM

## 2018-03-15 DIAGNOSIS — Z124 Encounter for screening for malignant neoplasm of cervix: Secondary | ICD-10-CM | POA: Diagnosis not present

## 2018-03-15 DIAGNOSIS — Z1239 Encounter for other screening for malignant neoplasm of breast: Secondary | ICD-10-CM

## 2018-03-15 DIAGNOSIS — Z01419 Encounter for gynecological examination (general) (routine) without abnormal findings: Secondary | ICD-10-CM

## 2018-03-15 DIAGNOSIS — Z0000 Encounter for general adult medical examination without abnormal findings: Secondary | ICD-10-CM

## 2018-03-15 DIAGNOSIS — R634 Abnormal weight loss: Secondary | ICD-10-CM | POA: Insufficient documentation

## 2018-03-15 DIAGNOSIS — F1911 Other psychoactive substance abuse, in remission: Secondary | ICD-10-CM | POA: Insufficient documentation

## 2018-03-15 DIAGNOSIS — N898 Other specified noninflammatory disorders of vagina: Secondary | ICD-10-CM

## 2018-03-15 DIAGNOSIS — Z87898 Personal history of other specified conditions: Secondary | ICD-10-CM

## 2018-03-15 DIAGNOSIS — R197 Diarrhea, unspecified: Secondary | ICD-10-CM | POA: Insufficient documentation

## 2018-03-15 DIAGNOSIS — Z113 Encounter for screening for infections with a predominantly sexual mode of transmission: Secondary | ICD-10-CM

## 2018-03-15 LAB — POCT URINE PREGNANCY: Preg Test, Ur: NEGATIVE

## 2018-03-15 NOTE — Progress Notes (Signed)
Gynecology Annual Exam   PCP: [facility]  Chief Complaint:  Chief Complaint  Patient presents with  . Gynecologic Exam    History of Present Illness: Patient is a 27 y.o. [id number] presents for annual exam. The patient complains today of excessive, unintentional weight loss of 33 lbs since last June. She reports that she is having 6-7  Loose bowel moevements a day. She has tried boost and meal supplements. She denies blood or mucus in her stools. She has some nausea and vomiting. But the diarrhea is a more severe symptoms. She reports that she at early satiety and can only eat small amounts. She has had a significant social change. She reports she left her significant other who was a bad influence and moved in with her mother. She had problems with addiction, but has been working to have a better life and not abuse substances.  She has been dizzy and light headed at times and felt that she would collapse from fatigue.  She also reports a thick white vaginal discharge. She had one sexual encounter in December with her ex and would like to be screened for STDs. She reports that men now repulse her and she has no interest in sexual intimacy anymore.   She also reports frequent urination, hourly. No incontinence.    LMP: Patient's last menstrual period was 12/02/2017 (approximate). Average Interval: regular, 28 days Duration of flow: 5 days Heavy Menses: no Clots: no Intermenstrual Bleeding: no Postcoital Bleeding: no Dysmenorrhea: no  The patient is not currently sexually active. She currently uses none for contraception. She denies dyspareunia.  The patient does perform self breast exams.  There is no notable family history of breast or ovarian cancer in her family.  The patient wears seatbelts: yes.   The patient has regular exercise: not asked.    The patient reports current symptoms of anxiety, denies depression. 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    Review of Systems: Review of Systems  Constitutional: Positive for malaise/fatigue and weight loss. Negative for chills and fever.  HENT: Negative for congestion, hearing loss and sinus pain.   Eyes: Negative for blurred vision and double vision.  Respiratory: Negative for cough, sputum production, shortness of breath and wheezing.   Cardiovascular: Negative for chest pain, palpitations, orthopnea and leg swelling.  Gastrointestinal: Positive for diarrhea, nausea and vomiting. Negative for abdominal pain and constipation.  Genitourinary: Positive for frequency. Negative for dysuria, flank pain, hematuria and urgency.  Musculoskeletal: Negative for back pain, falls and joint pain.  Skin: Negative for itching and rash.  Neurological: Positive for dizziness and weakness. Negative for headaches.  Psychiatric/Behavioral: Negative for depression, substance abuse and suicidal ideas. The patient is nervous/anxious.     Past Medical History:  Past Medical History:  Diagnosis Date  . Anxiety   . Kidney stones   . Ovarian cyst   . Pelvic pain 05/08/2013  . Urinary tract infection     Past Surgical History:  Past Surgical History:  Procedure Laterality Date  . TONSILLECTOMY      Gynecologic History:  Patient's last menstrual period was 12/02/2017 (approximate). Contraception: none Last Pap: Results were: NIL   Obstetric History: [id number]  Family History:  Family History  Problem Relation Age of Onset  . Bipolar disorder Mother   . Anxiety disorder Mother     Social History:  Social History   Socioeconomic History  .  Marital status: Single    Spouse name: Not on file  . Number of children: Not on file  . Years of education: Not on file  . Highest education level: Not on file  Occupational History  . Not on file  Social Needs  . Financial resource strain: Not on file  . Food insecurity:    Worry: Not on file    Inability: Not on  file  . Transportation needs:    Medical: Not on file    Non-medical: Not on file  Tobacco Use  . Smoking status: Current Every Day Smoker    Packs/day: 1.00    Types: Cigarettes  . Smokeless tobacco: Never Used  Substance and Sexual Activity  . Alcohol use: Yes  . Drug use: Never  . Sexual activity: Yes    Birth control/protection: None, Condom  Lifestyle  . Physical activity:    Days per week: 4 days    Minutes per session: 60 min  . Stress: To some extent  Relationships  . Social connections:    Talks on phone: Not on file    Gets together: Not on file    Attends religious service: Not on file    Active member of club or organization: Not on file    Attends meetings of clubs or organizations: Not on file    Relationship status: Not on file  . Intimate partner violence:    Fear of current or ex partner: Not on file    Emotionally abused: Not on file    Physically abused: Not on file    Forced sexual activity: Not on file  Other Topics Concern  . Not on file  Social History Narrative  . Not on file    Allergies:  No Known Allergies  Medications: Prior to Admission medications   Medication Sig Start Date End Date Taking? Authorizing Provider  buprenorphine (SUBUTEX) 8 MG SUBL SL tablet TK 1 T PO BID 02/20/18  Yes [provider]    Physical Exam Vitals: Blood pressure 100/60, pulse 96, height  (1.651 m), weight 112 lb (50.8 kg), last menstrual period 12/02/2017.  General: NAD HEENT: normocephalic, anicteric Thyroid: no enlargement, no palpable nodules Pulmonary: No increased work of breathing, CTAB Cardiovascular: RRR, distal pulses 2+ Breast: Breast symmetrical, no tenderness, no palpable nodules or masses, no skin or nipple retraction present, no nipple discharge.  No axillary or supraclavicular lymphadenopathy. Abdomen: NABS, soft, non-tender, non-distended.  Umbilicus without lesions.  No hepatomegaly, splenomegaly or masses palpable. No  evidence of hernia  Genitourinary:  External: Normal external female genitalia.  Normal urethral meatus, normal Bartholin's and Skene's glands.    Vagina: Normal vaginal mucosa, no evidence of prolapse.    Cervix: Grossly normal in appearance, no bleeding  Uterus: Non-enlarged, mobile, normal contour.  No CMT  Adnexa: ovaries non-enlarged, no adnexal masses  Rectal: deferred  Lymphatic: no evidence of inguinal lymphadenopathy Extremities: no edema, erythema, or tenderness Neurologic: Grossly intact Psychiatric: mood appropriate, affect full  Female chaperone present for pelvic and breast  portions of the physical exam    Assessment: 27 y.o. [id number] routine annual exam  Plan: Problem List Items Addressed This Visit      Other   Excessive body weight loss   Relevant Orders   Ambulatory referral to Gastroenterology   Diarrhea   Relevant Orders   Ambulatory referral to Gastroenterology    Other Visit Diagnoses    Missed period    -  Primary  Relevant Orders   POCT urine pregnancy (Completed)   Healthcare maintenance       Relevant Orders   CBC   Encounter for cervical Pap smear with pelvic exam       Relevant Orders   PapIG, CtNgTv, HPV, rfx 16/18   Screening for cervical cancer       Screening breast examination       Early satiety       Relevant Orders   Ambulatory referral to Gastroenterology   Vaginal discharge       Relevant Orders   NuSwab Vaginitis (VG)   Screening examination for STD (sexually transmitted disease)       Relevant Orders   HIV antibody   Hepatitis panel, acute   RPR   Hx of substance abuse       Relevant Orders   Comprehensive metabolic panel   HIV antibody   Hepatitis panel, acute   RPR   Amenorrhea       Relevant Orders   TSH   Prolactin   FSH      2) STI screening  was offered and accepted  2)  ASCCP guidelines and rational discussed.  Patient opts for every 3 years screening interval  3) Contraception - the patient is  currently using  none.  She is happy with her current form of contraception and plans to continue  4) Routine healthcare maintenance including cholesterol, diabetes screening discussed Ordered today  5)GI referral for excessive weight loss of 33 lbs in about 1 year.    6)  Return in about 1 month (around 04/15/2018) for follow up GYN.   Adelene Idler MD [facility] 03/15/18 6:34 PM

## 2018-03-20 ENCOUNTER — Other Ambulatory Visit: Payer: Self-pay | Admitting: Obstetrics and Gynecology

## 2018-03-20 DIAGNOSIS — B373 Candidiasis of vulva and vagina: Secondary | ICD-10-CM

## 2018-03-20 DIAGNOSIS — B3731 Acute candidiasis of vulva and vagina: Secondary | ICD-10-CM

## 2018-03-20 LAB — PAPIG, CTNGTV, HPV, RFX 16/18
CHLAMYDIA, NUC. ACID AMP: NEGATIVE
GONOCOCCUS, NUC. ACID AMP: POSITIVE — AB
HPV, high-risk: NEGATIVE
PAP Smear Comment: 0
TRICH VAG BY NAA: NEGATIVE

## 2018-03-20 LAB — NUSWAB VAGINITIS (VG)
CANDIDA ALBICANS, NAA: POSITIVE — AB
CANDIDA GLABRATA, NAA: NEGATIVE
Trich vag by NAA: NEGATIVE

## 2018-03-20 MED ORDER — FLUCONAZOLE 150 MG PO TABS: 150.0000 mg | ORAL_TABLET | ORAL | 0 refills | Status: DC

## 2018-03-23 ENCOUNTER — Other Ambulatory Visit: Payer: Self-pay | Admitting: Obstetrics and Gynecology

## 2018-03-23 DIAGNOSIS — A549 Gonococcal infection, unspecified: Secondary | ICD-10-CM

## 2018-03-23 MED ORDER — AZITHROMYCIN 500 MG PO TABS: 1000.0000 mg | ORAL_TABLET | Freq: Once | ORAL | 0 refills | Status: AC

## 2018-03-23 NOTE — Progress Notes (Signed)
Called and discussed result with patient. She knows that she needs to come to the office for rocephin. She will come in on Monday 03/27/18 for this. Prescriptions for azithromycin also sent to the pharmacy.  Will need test of cure in 2-3 months.

## 2018-03-24 ENCOUNTER — Other Ambulatory Visit: Payer: Medicaid Other

## 2018-03-24 ENCOUNTER — Ambulatory Visit (INDEPENDENT_AMBULATORY_CARE_PROVIDER_SITE_OTHER): Payer: Medicaid Other

## 2018-03-24 DIAGNOSIS — A549 Gonococcal infection, unspecified: Secondary | ICD-10-CM | POA: Diagnosis not present

## 2018-03-24 MED ORDER — CEFTRIAXONE SODIUM 250 MG IJ SOLR
250.0000 mg | Freq: Once | INTRAMUSCULAR | Status: AC
  Administered 2018-03-24: 250 mg via INTRAMUSCULAR

## 2018-03-24 NOTE — Progress Notes (Signed)
Pt given Rocephin 250mg 

## 2018-03-27 ENCOUNTER — Other Ambulatory Visit: Payer: Medicaid Other

## 2018-04-03 ENCOUNTER — Other Ambulatory Visit: Payer: Self-pay | Admitting: Obstetrics and Gynecology

## 2018-04-03 NOTE — Progress Notes (Signed)
NIL pap, prescription for diflucan sent 03/20/18.

## 2018-05-08 ENCOUNTER — Encounter: Payer: Self-pay | Admitting: *Deleted

## 2018-05-08 ENCOUNTER — Ambulatory Visit: Payer: Medicaid Other | Admitting: Gastroenterology

## 2018-05-08 ENCOUNTER — Other Ambulatory Visit: Payer: Self-pay

## 2018-09-18 ENCOUNTER — Other Ambulatory Visit: Payer: Self-pay | Admitting: Obstetrics & Gynecology

## 2018-09-18 ENCOUNTER — Telehealth: Payer: Self-pay

## 2018-09-18 MED ORDER — VALACYCLOVIR HCL 500 MG PO TABS: 500.0000 mg | ORAL_TABLET | Freq: Two times a day (BID) | ORAL | 0 refills | Status: AC

## 2018-09-18 NOTE — Telephone Encounter (Signed)
Pt inquiring if she needs an appointment or if she can get a rx. KG#[phone]

## 2018-09-18 NOTE — Telephone Encounter (Signed)
Spoke w/pt. She is requesting a Valtrex rx. States we have never rx'd it for her before. [facility]. 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 She has been having s&s since Friday evening. Sees Dr. Jerene PitchSchuman but would like another provider to review in Schuman's absence. [facility].

## 2018-09-18 NOTE — Telephone Encounter (Signed)
[facility]

## 2018-09-22 ENCOUNTER — Ambulatory Visit: Payer: Medicaid Other | Admitting: Obstetrics and Gynecology

## 2018-09-28 ENCOUNTER — Telehealth: Payer: Self-pay

## 2018-09-28 ENCOUNTER — Other Ambulatory Visit: Payer: Self-pay | Admitting: Obstetrics and Gynecology

## 2018-09-28 DIAGNOSIS — B373 Candidiasis of vulva and vagina: Secondary | ICD-10-CM

## 2018-09-28 DIAGNOSIS — B3731 Acute candidiasis of vulva and vagina: Secondary | ICD-10-CM

## 2018-09-28 MED ORDER — FLUCONAZOLE 150 MG PO TABS: 150.0000 mg | ORAL_TABLET | ORAL | 0 refills | Status: AC

## 2018-09-28 NOTE — Telephone Encounter (Signed)
Medication sent. Please call and notify

## 2018-09-28 NOTE — Telephone Encounter (Signed)
Pt wants diflucan called in for a yeast inf.  Can't use cream because she is on her feet all day and all night for work.  Has appt tomorrow but is miserable.  [phone]

## 2018-09-28 NOTE — Telephone Encounter (Signed)
Please advise. Thank you

## 2018-09-29 ENCOUNTER — Ambulatory Visit: Payer: Medicaid Other | Admitting: Obstetrics and Gynecology

## 2018-09-29 ENCOUNTER — Other Ambulatory Visit: Payer: Self-pay | Admitting: Obstetrics and Gynecology

## 2018-09-29 DIAGNOSIS — A6009 Herpesviral infection of other urogenital tract: Secondary | ICD-10-CM

## 2018-09-29 MED ORDER — VALACYCLOVIR HCL 1 G PO TABS: 1000.0000 mg | ORAL_TABLET | Freq: Every day | ORAL | 6 refills | Status: AC

## 2018-09-29 NOTE — Telephone Encounter (Signed)
Pt had appt today at 10:30 would like to cancel appt if she could have a refill on her valtrex, pt would like to do suppressive therapy with the valtrex. Thank you!

## 2018-09-29 NOTE — Telephone Encounter (Signed)
Prescription sent for 6 month supply. Will need to follow up after that for annual

## 2018-11-14 ENCOUNTER — Telehealth: Payer: Self-pay

## 2018-11-14 NOTE — Telephone Encounter (Signed)
Please advise. Thank you

## 2018-11-14 NOTE — Telephone Encounter (Signed)
Pt calling for another rx for diflucan be sent in as she has taken an antibiotic.  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  [phone]

## 2018-11-15 ENCOUNTER — Other Ambulatory Visit: Payer: Self-pay | Admitting: Obstetrics and Gynecology

## 2018-11-15 DIAGNOSIS — B373 Candidiasis of vulva and vagina: Secondary | ICD-10-CM

## 2018-11-15 DIAGNOSIS — B3731 Acute candidiasis of vulva and vagina: Secondary | ICD-10-CM

## 2018-11-15 MED ORDER — FLUCONAZOLE 150 MG PO TABS: 150.0000 mg | ORAL_TABLET | ORAL | 3 refills | Status: DC

## 2018-11-15 NOTE — Telephone Encounter (Signed)
I sent a prescription to her pharmacy- please call and notify patient, thank you

## 2018-12-25 ENCOUNTER — Telehealth: Payer: Self-pay

## 2018-12-25 NOTE — Telephone Encounter (Signed)
Pt calling for refill of rx for yeast inf.  [phone]

## 2018-12-29 ENCOUNTER — Other Ambulatory Visit: Payer: Self-pay

## 2018-12-29 DIAGNOSIS — B373 Candidiasis of vulva and vagina: Secondary | ICD-10-CM

## 2018-12-29 DIAGNOSIS — B3731 Acute candidiasis of vulva and vagina: Secondary | ICD-10-CM

## 2018-12-29 MED ORDER — FLUCONAZOLE 150 MG PO TABS: 150.0000 mg | ORAL_TABLET | ORAL | 0 refills | Status: DC

## 2018-12-29 NOTE — Telephone Encounter (Signed)
Pt should have 3 refills on her Past Rx if she has ran out of them I will ask [employer]

## 2018-12-29 NOTE — Telephone Encounter (Signed)
I refilled for one dose of the medication. I called pt to let her know and advised her to take a probiotic and eat yogurt sense she keeps getting recurrent yeast infections. I told her I would contact Dr.Schuman about the issue and see what she feels is the best course of treatment going forward.

## 2019-01-01 NOTE — Telephone Encounter (Signed)
She needs to be seen if this is a continuing issue, no more refills.

## 2019-01-03 NOTE — Telephone Encounter (Signed)
Yes ma'am I will call and let her know.

## 2019-06-29 ENCOUNTER — Other Ambulatory Visit: Payer: Self-pay

## 2019-06-29 DIAGNOSIS — B3731 Acute candidiasis of vulva and vagina: Secondary | ICD-10-CM

## 2019-06-29 DIAGNOSIS — B373 Candidiasis of vulva and vagina: Secondary | ICD-10-CM

## 2019-06-29 MED ORDER — FLUCONAZOLE 150 MG PO TABS: 150.0000 mg | ORAL_TABLET | ORAL | 0 refills | Status: DC

## 2019-07-04 ENCOUNTER — Ambulatory Visit (INDEPENDENT_AMBULATORY_CARE_PROVIDER_SITE_OTHER): Payer: Medicaid Other | Admitting: Obstetrics and Gynecology

## 2019-07-04 ENCOUNTER — Other Ambulatory Visit: Payer: Self-pay | Admitting: Obstetrics and Gynecology

## 2019-07-04 ENCOUNTER — Encounter: Payer: Self-pay | Admitting: Obstetrics and Gynecology

## 2019-07-04 ENCOUNTER — Other Ambulatory Visit: Payer: Self-pay

## 2019-07-04 ENCOUNTER — Other Ambulatory Visit (HOSPITAL_COMMUNITY)
Admission: RE | Admit: 2019-07-04 | Discharge: 2019-07-04 | Disposition: A | Discharge status: Home / Self Care | Payer: Medicaid Other | Source: Ambulatory Visit | Attending: Obstetrics and Gynecology | Admitting: Obstetrics and Gynecology

## 2019-07-04 VITALS — BP 100/60 | Ht 65.0 in | Wt 111.0 lb

## 2019-07-04 DIAGNOSIS — R300 Dysuria: Secondary | ICD-10-CM

## 2019-07-04 DIAGNOSIS — N760 Acute vaginitis: Secondary | ICD-10-CM | POA: Diagnosis not present

## 2019-07-04 DIAGNOSIS — Z113 Encounter for screening for infections with a predominantly sexual mode of transmission: Secondary | ICD-10-CM | POA: Insufficient documentation

## 2019-07-04 DIAGNOSIS — B9689 Other specified bacterial agents as the cause of diseases classified elsewhere: Secondary | ICD-10-CM

## 2019-07-04 LAB — POCT WET PREP WITH KOH
Clue Cells Wet Prep HPF POC: POSITIVE
KOH Prep POC: POSITIVE — AB
Trichomonas, UA: NEGATIVE
Yeast Wet Prep HPF POC: NEGATIVE

## 2019-07-04 LAB — POCT URINALYSIS DIPSTICK
Bilirubin, UA: NEGATIVE
Blood, UA: NEGATIVE
Glucose, UA: NEGATIVE
Ketones, UA: NEGATIVE
Nitrite, UA: NEGATIVE
Protein, UA: NEGATIVE
Spec Grav, UA: 1.015 (ref 1.010–1.025)
pH, UA: 7.5 (ref 5.0–8.0)

## 2019-07-04 MED ORDER — METRONIDAZOLE 500 MG PO TABS: 500.0000 mg | ORAL_TABLET | Freq: Two times a day (BID) | ORAL | 0 refills | Status: AC

## 2019-07-04 NOTE — Patient Instructions (Signed)
I value your feedback and entrusting us with your care. If you get a [facility] patient survey, I would appreciate you taking the time to let us know about your experience today. Thank you! 

## 2019-07-04 NOTE — Progress Notes (Signed)
[facility]   Chief Complaint  Patient presents with  . Urinary Tract Infection    burning and frequency urinating, no blood in urine x 3-4  . STD screening  . Vaginal Discharge    itchiness at first, irritation, no odor x 3-4    HPI:      Ms. Amanda [name] is a 28 y.o. [id number] who LMP was Patient's last menstrual period was 06/18/2019 (exact date)., presents today for increased vag d/c with irritation, no odor for 3-4 days. Was on abx for UTI about 2 wks ago. Dr. Jerene PitchSchuman eRxd diflucan a few days ago but pt hasn't taken it yet. Pt also notes dysuria, frequency without hematuria/pelvic pain. Has had mild LBP, possible chills. Treated for UTI about 2 wks with sx improvement until the past 3-4 days.  Pt is sex active, no new partners. Using condoms except had unprotected sex last wk. Wants STD testing. Hx of gonorrhea 5/19.   Past Medical History:  Diagnosis Date  . Anxiety   . Kidney stones   . Ovarian cyst   . Pelvic pain 05/08/2013  . Urinary tract infection     Past Surgical History:  Procedure Laterality Date  . TONSILLECTOMY      Family History  Problem Relation Age of Onset  . Bipolar disorder Mother   . Anxiety disorder Mother     Social History   Socioeconomic History  . Marital status: Single    Spouse name: Not on file  . Number of children: Not on file  . Years of education: Not on file  . Highest education level: Not on file  Occupational History  . Not on file  Social Needs  . Financial resource strain: Not on file  . Food insecurity    Worry: Not on file    Inability: Not on file  . Transportation needs    Medical: Not on file    Non-medical: Not on file  Tobacco Use  . Smoking status: Current Every Day Smoker    Packs/day: 1.00    Types: Cigarettes  . Smokeless tobacco: Never Used  Substance and Sexual Activity  . Alcohol use: Yes  . Drug use: Never  . Sexual activity: Yes    Birth control/protection: None  Lifestyle   . Physical activity    Days per week: 4 days    Minutes per session: 60 min  . Stress: To some extent  Relationships  . Social Musicianconnections    Talks on phone: Not on file    Gets together: Not on file    Attends religious service: Not on file    Active member of club or organization: Not on file    Attends meetings of clubs or organizations: Not on file    Relationship status: Not on file  . Intimate partner violence    Fear of current or ex partner: Not on file    Emotionally abused: Not on file    Physically abused: Not on file    Forced sexual activity: Not on file  Other Topics Concern  . Not on file  Social History Narrative  . Not on file    Outpatient Medications Prior to Visit  Medication Sig Dispense Refill  . buprenorphine (SUBUTEX) 8 MG SUBL SL tablet TK 1 T PO BID  0  . valACYclovir (VALTREX) 1000 MG tablet TAKE 1 TABLET (1,000 MG TOTAL) BY MOUTH DAILY FOR 5 DAYS.    Marland Kitchen. azithromycin (ZITHROMAX) 500 MG  tablet TAKE 2 TABLETS BY MOUTH FOR 1 DOSE  0  . fluconazole (DIFLUCAN) 150 MG tablet Take 1 tablet (150 mg total) by mouth every 3 (three) days. Can take additional dose three days later if symptoms persist 2 tablet 0   No facility-administered medications prior to visit.       ROS:  Review of Systems  Constitutional: Negative for fatigue, fever and unexpected weight change.  Respiratory: Negative for cough, shortness of breath and wheezing.   Cardiovascular: Negative for chest pain, palpitations and leg swelling.  Gastrointestinal: Negative for blood in stool, constipation, diarrhea, nausea and vomiting.  Endocrine: Negative for cold intolerance, heat intolerance and polyuria.  Genitourinary: Positive for dyspareunia, dysuria, frequency and vaginal discharge. Negative for flank pain, genital sores, hematuria, menstrual problem, pelvic pain, urgency, vaginal bleeding and vaginal pain.  Musculoskeletal: Negative for back pain, joint swelling and myalgias.  Skin:  Negative for rash.  Neurological: Negative for dizziness, syncope, light-headedness, numbness and headaches.  Hematological: Negative for adenopathy.  Psychiatric/Behavioral: Positive for agitation. Negative for confusion, sleep disturbance and suicidal ideas. The patient is not nervous/anxious.   BREAST: No symptoms   OBJECTIVE:   Vitals:  BP 100/60   Ht 5\' 5"  (1.651 m)   Wt 111 lb (50.3 kg)   LMP 06/18/2019 (Exact Date)   BMI 18.47 kg/m   Physical Exam Vitals signs reviewed.  Constitutional:      Appearance: She is well-developed.  Neck:     Musculoskeletal: Normal range of motion.  Pulmonary:     Effort: Pulmonary effort is normal.  Abdominal:     Palpations: Abdomen is soft.     Tenderness: There is abdominal tenderness in the left lower quadrant. There is no guarding or rebound.  Genitourinary:    General: Normal vulva.     Pubic Area: No rash.      Labia:        Right: No rash, tenderness or lesion.        Left: No rash, tenderness or lesion.      Vagina: Normal. No vaginal discharge, erythema or tenderness.     Cervix: Normal.     Uterus: Normal. Not enlarged and not tender.      Adnexa: Right adnexa normal and left adnexa normal.       Right: No mass or tenderness.         Left: No mass or tenderness.    Musculoskeletal: Normal range of motion.  Skin:    General: Skin is warm and dry.  Neurological:     General: No focal deficit present.     Mental Status: She is alert and oriented to person, place, and time.  Psychiatric:        Mood and Affect: Mood normal.        Behavior: Behavior normal.        Thought Content: Thought content normal.        Judgment: Judgment normal.     Results: Results for orders placed or performed in visit on 07/04/19 (from the past 24 hour(s))  POCT Wet Prep with KOH     Status: Abnormal   Collection Time: 07/04/19 10:38 AM  Result Value Ref Range   Trichomonas, UA Negative    Clue Cells Wet Prep HPF POC pos     Epithelial Wet Prep HPF POC     Yeast Wet Prep HPF POC neg    Bacteria Wet Prep HPF POC     RBC Wet Prep  HPF POC     WBC Wet Prep HPF POC     KOH Prep POC Positive (A) Negative  POCT Urinalysis Dipstick     Status: Abnormal   Collection Time: 07/04/19 10:38 AM  Result Value Ref Range   Color, UA yellow    Clarity, UA clear    Glucose, UA Negative Negative   Bilirubin, UA neg    Ketones, UA neg    Spec Grav, UA 1.015 1.010 - 1.025   Blood, UA neg    pH, UA 7.5 5.0 - 8.0   Protein, UA Negative Negative   Urobilinogen, UA     Nitrite, UA neg    Leukocytes, UA Trace (A) Negative   Appearance     Odor       Assessment/Plan: Bacterial vaginosis - Plan: POCT Wet Prep with KOH, metroNIDAZOLE (FLAGYL) 500 MG tablet; Pos wet prep. Rx flagyl, no EtOH use. F/u prn.  Dysuria - Plan: POCT Urinalysis Dipstick, Urine Culture; essentially neg UA. Check C&S. Start diflucan (already has Rx) in case cause of sx. Will f/u with results if pos.  Screening for STD (sexually transmitted disease) - Plan: Cervicovaginal ancillary only    Meds ordered this encounter  Medications  . metroNIDAZOLE (FLAGYL) 500 MG tablet    Sig: Take 1 tablet (500 mg total) by mouth 2 (two) times daily for 7 days.    Dispense:  14 tablet    Refill:  0    Order Specific Question:   Supervising Provider    Answer:   Nadara Mustard [[id number]]      Return if symptoms worsen or fail to improve.   B. , PA-C 07/04/2019 10:40 AM

## 2019-07-05 LAB — CERVICOVAGINAL ANCILLARY ONLY
Chlamydia: NEGATIVE
Neisseria Gonorrhea: NEGATIVE
Trichomonas: NEGATIVE

## 2019-07-06 ENCOUNTER — Telehealth: Payer: Self-pay | Admitting: Obstetrics and Gynecology

## 2019-07-06 LAB — URINE CULTURE

## 2019-07-06 NOTE — Telephone Encounter (Signed)
Patient is returning missed call. Patient is calling for labs results. Please advise.

## 2019-07-06 NOTE — Progress Notes (Signed)
Pls let pt know C&S neg. Sx should improve with diflucan.

## 2019-07-06 NOTE — Progress Notes (Signed)
Pt aware of results. Says she is feeling alright.

## 2019-07-06 NOTE — Telephone Encounter (Signed)
Pt returning your call

## 2019-07-06 NOTE — Progress Notes (Signed)
Called pt, no answer, could not leave voice msg due to mail box full. 

## 2019-07-06 NOTE — Telephone Encounter (Signed)
Pt aware.

## 2019-08-29 ENCOUNTER — Emergency Department: Payer: Medicaid Other

## 2019-08-29 ENCOUNTER — Other Ambulatory Visit: Payer: Self-pay

## 2019-08-29 ENCOUNTER — Inpatient Hospital Stay
Admission: EM | Admit: 2019-08-29 | Discharge: 2019-08-31 | DRG: 580 | Disposition: A | Discharge status: Home / Self Care | Payer: Medicaid Other | Attending: Internal Medicine | Admitting: Internal Medicine

## 2019-08-29 ENCOUNTER — Encounter: Payer: Self-pay | Admitting: Emergency Medicine

## 2019-08-29 DIAGNOSIS — F1420 Cocaine dependence, uncomplicated: Secondary | ICD-10-CM | POA: Diagnosis present

## 2019-08-29 DIAGNOSIS — F17200 Nicotine dependence, unspecified, uncomplicated: Secondary | ICD-10-CM | POA: Diagnosis not present

## 2019-08-29 DIAGNOSIS — Z6822 Body mass index (BMI) 22.0-22.9, adult: Secondary | ICD-10-CM

## 2019-08-29 DIAGNOSIS — L03115 Cellulitis of right lower limb: Secondary | ICD-10-CM | POA: Diagnosis present

## 2019-08-29 DIAGNOSIS — F1120 Opioid dependence, uncomplicated: Secondary | ICD-10-CM | POA: Diagnosis present

## 2019-08-29 DIAGNOSIS — E440 Moderate protein-calorie malnutrition: Secondary | ICD-10-CM | POA: Diagnosis present

## 2019-08-29 DIAGNOSIS — L02611 Cutaneous abscess of right foot: Secondary | ICD-10-CM | POA: Diagnosis present

## 2019-08-29 DIAGNOSIS — F1320 Sedative, hypnotic or anxiolytic dependence, uncomplicated: Secondary | ICD-10-CM | POA: Diagnosis present

## 2019-08-29 DIAGNOSIS — F17210 Nicotine dependence, cigarettes, uncomplicated: Secondary | ICD-10-CM | POA: Diagnosis present

## 2019-08-29 DIAGNOSIS — L0291 Cutaneous abscess, unspecified: Secondary | ICD-10-CM

## 2019-08-29 DIAGNOSIS — F1920 Other psychoactive substance dependence, uncomplicated: Secondary | ICD-10-CM

## 2019-08-29 DIAGNOSIS — F419 Anxiety disorder, unspecified: Secondary | ICD-10-CM

## 2019-08-29 DIAGNOSIS — Z20828 Contact with and (suspected) exposure to other viral communicable diseases: Secondary | ICD-10-CM | POA: Diagnosis present

## 2019-08-29 DIAGNOSIS — E876 Hypokalemia: Secondary | ICD-10-CM | POA: Diagnosis present

## 2019-08-29 DIAGNOSIS — L02619 Cutaneous abscess of unspecified foot: Secondary | ICD-10-CM | POA: Diagnosis not present

## 2019-08-29 DIAGNOSIS — F1910 Other psychoactive substance abuse, uncomplicated: Secondary | ICD-10-CM | POA: Diagnosis present

## 2019-08-29 DIAGNOSIS — Z818 Family history of other mental and behavioral disorders: Secondary | ICD-10-CM | POA: Diagnosis not present

## 2019-08-29 DIAGNOSIS — F1911 Other psychoactive substance abuse, in remission: Secondary | ICD-10-CM | POA: Diagnosis present

## 2019-08-29 DIAGNOSIS — F1520 Other stimulant dependence, uncomplicated: Secondary | ICD-10-CM | POA: Diagnosis present

## 2019-08-29 DIAGNOSIS — F1010 Alcohol abuse, uncomplicated: Secondary | ICD-10-CM | POA: Diagnosis present

## 2019-08-29 DIAGNOSIS — Z23 Encounter for immunization: Secondary | ICD-10-CM

## 2019-08-29 LAB — CBC WITH DIFFERENTIAL/PLATELET
Abs Immature Granulocytes: 0.02 10*3/uL (ref 0.00–0.07)
Basophils Absolute: 0 10*3/uL (ref 0.0–0.1)
Basophils Relative: 0 %
Eosinophils Absolute: 0.1 10*3/uL (ref 0.0–0.5)
Eosinophils Relative: 3 %
HCT: 35.7 % — ABNORMAL LOW (ref 36.0–46.0)
Hemoglobin: 11.8 g/dL — ABNORMAL LOW (ref 12.0–15.0)
Immature Granulocytes: 0 %
Lymphocytes Relative: 39 %
Lymphs Abs: 1.9 10*3/uL (ref 0.7–4.0)
MCH: 27.1 pg (ref 26.0–34.0)
MCHC: 33.1 g/dL (ref 30.0–36.0)
MCV: 81.9 fL (ref 80.0–100.0)
Monocytes Absolute: 0.4 10*3/uL (ref 0.1–1.0)
Monocytes Relative: 8 %
Neutro Abs: 2.4 10*3/uL (ref 1.7–7.7)
Neutrophils Relative %: 50 %
Platelets: 126 10*3/uL — ABNORMAL LOW (ref 150–400)
RBC: 4.36 MIL/uL (ref 3.87–5.11)
RDW: 14.3 % (ref 11.5–15.5)
WBC: 4.9 10*3/uL (ref 4.0–10.5)
nRBC: 0 % (ref 0.0–0.2)

## 2019-08-29 LAB — SARS CORONAVIRUS 2 (TAT 6-24 HRS): SARS Coronavirus 2: NEGATIVE

## 2019-08-29 LAB — BLOOD GAS, VENOUS
Acid-Base Excess: 2 mmol/L (ref 0.0–2.0)
Bicarbonate: 27.3 mmol/L (ref 20.0–28.0)
O2 Saturation: 65.5 %
Patient temperature: 37
pCO2, Ven: 44 mmHg (ref 44.0–60.0)
pH, Ven: 7.4 (ref 7.250–7.430)
pO2, Ven: 34 mmHg (ref 32.0–45.0)

## 2019-08-29 LAB — URINALYSIS, COMPLETE (UACMP) WITH MICROSCOPIC
Bacteria, UA: NONE SEEN
Bilirubin Urine: NEGATIVE
Glucose, UA: NEGATIVE mg/dL
Ketones, ur: NEGATIVE mg/dL
Nitrite: NEGATIVE
Protein, ur: NEGATIVE mg/dL
Specific Gravity, Urine: 1.012 (ref 1.005–1.030)
pH: 6 (ref 5.0–8.0)

## 2019-08-29 LAB — COMPREHENSIVE METABOLIC PANEL
ALT: 158 U/L — ABNORMAL HIGH (ref 0–44)
AST: 131 U/L — ABNORMAL HIGH (ref 15–41)
Albumin: 3.9 g/dL (ref 3.5–5.0)
Alkaline Phosphatase: 89 U/L (ref 38–126)
Anion gap: 9 (ref 5–15)
BUN: 11 mg/dL (ref 6–20)
CO2: 25 mmol/L (ref 22–32)
Calcium: 8.6 mg/dL — ABNORMAL LOW (ref 8.9–10.3)
Chloride: 99 mmol/L (ref 98–111)
Creatinine, Ser: 0.54 mg/dL (ref 0.44–1.00)
GFR calc Af Amer: >60 mL/min (ref >60)
GFR calc non Af Amer: >60 mL/min (ref >60)
Glucose, Bld: 107 mg/dL — ABNORMAL HIGH (ref 70–99)
Potassium: 2.9 mmol/L — ABNORMAL LOW (ref 3.5–5.1)
Sodium: 133 mmol/L — ABNORMAL LOW (ref 135–145)
Total Bilirubin: 1.1 mg/dL (ref 0.3–1.2)
Total Protein: 7.9 g/dL (ref 6.5–8.1)

## 2019-08-29 LAB — URINE DRUG SCREEN, QUALITATIVE (ARMC ONLY)
Amphetamines, Ur Screen: POSITIVE — AB
Barbiturates, Ur Screen: NOT DETECTED
Benzodiazepine, Ur Scrn: POSITIVE — AB
Cannabinoid 50 Ng, Ur ~~LOC~~: NOT DETECTED
Cocaine Metabolite,Ur ~~LOC~~: POSITIVE — AB
MDMA (Ecstasy)Ur Screen: NOT DETECTED
Methadone Scn, Ur: NOT DETECTED
Opiate, Ur Screen: POSITIVE — AB
Phencyclidine (PCP) Ur S: NOT DETECTED
Tricyclic, Ur Screen: NOT DETECTED

## 2019-08-29 LAB — ETHANOL: Alcohol, Ethyl (B): <10 mg/dL (ref <10)

## 2019-08-29 LAB — LACTIC ACID, PLASMA: Lactic Acid, Venous: 0.8 mmol/L (ref 0.5–1.9)

## 2019-08-29 LAB — PREGNANCY, URINE: Preg Test, Ur: NEGATIVE

## 2019-08-29 MED ORDER — SODIUM CHLORIDE 0.9 % IV SOLN: 250.0000 mL | INTRAVENOUS | Status: DC | PRN

## 2019-08-29 MED ORDER — VANCOMYCIN HCL IN DEXTROSE 1-5 GM/200ML-% IV SOLN
1000.0000 mg | Freq: Once | INTRAVENOUS | Status: AC
  Administered 2019-08-29: 09:00:00 1000 mg via INTRAVENOUS
  Filled 2019-08-29: qty 200

## 2019-08-29 MED ORDER — VANCOMYCIN HCL IN DEXTROSE 750-5 MG/150ML-% IV SOLN
750.0000 mg | Freq: Two times a day (BID) | INTRAVENOUS | Status: DC
  Administered 2019-08-30: 750 mg via INTRAVENOUS
  Filled 2019-08-29 (×4): qty 150

## 2019-08-29 MED ORDER — THIAMINE HCL 100 MG/ML IJ SOLN
100.0000 mg | Freq: Every day | INTRAMUSCULAR | Status: DC
  Administered 2019-08-30: 11:00:00 100 mg via INTRAVENOUS
  Filled 2019-08-29 (×2): qty 2

## 2019-08-29 MED ORDER — HYDROXYZINE HCL 25 MG PO TABS
25.0000 mg | ORAL_TABLET | Freq: Four times a day (QID) | ORAL | Status: DC | PRN
  Administered 2019-08-30: 03:00:00 50 mg via ORAL
  Filled 2019-08-29: qty 2

## 2019-08-29 MED ORDER — ONDANSETRON HCL 4 MG/2ML IJ SOLN: 4.0000 mg | Freq: Four times a day (QID) | INTRAMUSCULAR | Status: DC | PRN

## 2019-08-29 MED ORDER — HYDROCODONE-ACETAMINOPHEN 5-325 MG PO TABS
1.0000 | ORAL_TABLET | ORAL | Status: DC | PRN
  Administered 2019-08-30 – 2019-08-31 (×2): 2 via ORAL
  Filled 2019-08-29 (×2): qty 2

## 2019-08-29 MED ORDER — LORAZEPAM 2 MG/ML IJ SOLN
INTRAMUSCULAR | Status: AC
  Filled 2019-08-29: qty 1

## 2019-08-29 MED ORDER — ENOXAPARIN SODIUM 40 MG/0.4ML ~~LOC~~ SOLN
40.0000 mg | SUBCUTANEOUS | Status: DC
  Administered 2019-08-30: 18:00:00 40 mg via SUBCUTANEOUS
  Filled 2019-08-29: qty 0.4

## 2019-08-29 MED ORDER — LORAZEPAM 2 MG/ML IJ SOLN
1.0000 mg | INTRAMUSCULAR | Status: DC | PRN
  Administered 2019-08-29: 1 mg via INTRAVENOUS
  Administered 2019-08-30 (×7): 2 mg via INTRAVENOUS
  Administered 2019-08-31: 1 mg via INTRAVENOUS
  Administered 2019-08-31: 01:00:00 2 mg via INTRAVENOUS
  Filled 2019-08-29 (×10): qty 1

## 2019-08-29 MED ORDER — HYDROMORPHONE HCL 1 MG/ML IJ SOLN
1.0000 mg | Freq: Once | INTRAMUSCULAR | Status: AC
  Administered 2019-08-29: 1 mg via INTRAVENOUS
  Filled 2019-08-29: qty 1

## 2019-08-29 MED ORDER — VITAMIN B-1 100 MG PO TABS
100.0000 mg | ORAL_TABLET | Freq: Every day | ORAL | Status: DC
  Administered 2019-08-31: 09:00:00 100 mg via ORAL
  Filled 2019-08-29 (×2): qty 1

## 2019-08-29 MED ORDER — PNEUMOCOCCAL VAC POLYVALENT 25 MCG/0.5ML IJ INJ
0.5000 mL | INJECTION | INTRAMUSCULAR | Status: DC
  Filled 2019-08-29: qty 0.5

## 2019-08-29 MED ORDER — HYDROMORPHONE HCL 1 MG/ML IJ SOLN
1.0000 mg | INTRAMUSCULAR | Status: DC | PRN
  Administered 2019-08-29: 1 mg via INTRAVENOUS
  Filled 2019-08-29 (×2): qty 1

## 2019-08-29 MED ORDER — INFLUENZA VAC SPLIT QUAD 0.5 ML IM SUSY: 0.5000 mL | PREFILLED_SYRINGE | INTRAMUSCULAR | Status: DC

## 2019-08-29 MED ORDER — FOLIC ACID 1 MG PO TABS
1.0000 mg | ORAL_TABLET | Freq: Every day | ORAL | Status: DC
  Administered 2019-08-30 – 2019-08-31 (×2): 1 mg via ORAL
  Filled 2019-08-29 (×3): qty 1

## 2019-08-29 MED ORDER — LORAZEPAM 2 MG/ML IJ SOLN
2.0000 mg | Freq: Once | INTRAMUSCULAR | Status: AC
  Administered 2019-08-29: 2 mg via INTRAVENOUS

## 2019-08-29 MED ORDER — SODIUM CHLORIDE 0.9 % IV SOLN
Freq: Once | INTRAVENOUS | Status: AC
  Administered 2019-08-29: 09:00:00 via INTRAVENOUS

## 2019-08-29 MED ORDER — LIDOCAINE-EPINEPHRINE 1 %-1:100000 IJ SOLN
10.0000 mL | Freq: Once | INTRAMUSCULAR | Status: DC
  Filled 2019-08-29: qty 10

## 2019-08-29 MED ORDER — POLYETHYLENE GLYCOL 3350 17 G PO PACK: 17.0000 g | PACK | Freq: Every day | ORAL | Status: DC | PRN

## 2019-08-29 MED ORDER — LORAZEPAM 1 MG PO TABS
1.0000 mg | ORAL_TABLET | ORAL | Status: DC | PRN
  Administered 2019-08-31 (×3): 1 mg via ORAL
  Filled 2019-08-29 (×3): qty 1

## 2019-08-29 MED ORDER — SODIUM CHLORIDE 0.9% FLUSH: 3.0000 mL | INTRAVENOUS | Status: DC | PRN

## 2019-08-29 MED ORDER — SODIUM CHLORIDE 0.9% FLUSH
3.0000 mL | Freq: Two times a day (BID) | INTRAVENOUS | Status: DC
  Administered 2019-08-30: 11:00:00 3 mL via INTRAVENOUS

## 2019-08-29 MED ORDER — PIPERACILLIN-TAZOBACTAM 3.375 G IVPB
3.3750 g | Freq: Three times a day (TID) | INTRAVENOUS | Status: DC
  Administered 2019-08-29 – 2019-08-31 (×5): 3.375 g via INTRAVENOUS
  Filled 2019-08-29 (×4): qty 50

## 2019-08-29 MED ORDER — HALOPERIDOL LACTATE 5 MG/ML IJ SOLN
5.0000 mg | Freq: Once | INTRAMUSCULAR | Status: AC
  Administered 2019-08-29: 5 mg via INTRAVENOUS
  Filled 2019-08-29: qty 1

## 2019-08-29 MED ORDER — MAGNESIUM SULFATE 2 GM/50ML IV SOLN
2.0000 g | Freq: Once | INTRAVENOUS | Status: AC
  Administered 2019-08-29: 13:00:00 2 g via INTRAVENOUS
  Filled 2019-08-29: qty 50

## 2019-08-29 MED ORDER — SODIUM CHLORIDE 0.9 % IV SOLN
INTRAVENOUS | Status: DC
  Administered 2019-08-30 – 2019-08-31 (×4): via INTRAVENOUS

## 2019-08-29 MED ORDER — ONDANSETRON HCL 4 MG PO TABS: 4.0000 mg | ORAL_TABLET | Freq: Four times a day (QID) | ORAL | Status: DC | PRN

## 2019-08-29 MED ORDER — LIDOCAINE-EPINEPHRINE 2 %-1:100000 IJ SOLN
10.0000 mL | Freq: Once | INTRAMUSCULAR | Status: AC
  Administered 2019-08-29: 10 mL via INTRADERMAL

## 2019-08-29 MED ORDER — NICOTINE 21 MG/24HR TD PT24
21.0000 mg | MEDICATED_PATCH | Freq: Every day | TRANSDERMAL | Status: DC
  Administered 2019-08-29 – 2019-08-31 (×4): 21 mg via TRANSDERMAL
  Filled 2019-08-29 (×4): qty 1

## 2019-08-29 MED ORDER — PIPERACILLIN-TAZOBACTAM 3.375 G IVPB 30 MIN
3.3750 g | Freq: Once | INTRAVENOUS | Status: AC
  Administered 2019-08-29: 09:00:00 3.375 g via INTRAVENOUS
  Filled 2019-08-29: qty 50

## 2019-08-29 MED ORDER — POTASSIUM CHLORIDE 10 MEQ/100ML IV SOLN
10.0000 meq | INTRAVENOUS | Status: AC
  Administered 2019-08-29 (×4): 10 meq via INTRAVENOUS
  Filled 2019-08-29 (×4): qty 100

## 2019-08-29 MED ORDER — BISACODYL 10 MG RE SUPP
10.0000 mg | Freq: Every day | RECTAL | Status: DC | PRN
  Filled 2019-08-29: qty 1

## 2019-08-29 MED ORDER — ACETAMINOPHEN 325 MG PO TABS: 650.0000 mg | ORAL_TABLET | Freq: Four times a day (QID) | ORAL | Status: DC | PRN

## 2019-08-29 MED ORDER — BUPRENORPHINE HCL-NALOXONE HCL 8-2 MG SL SUBL
1.0000 | SUBLINGUAL_TABLET | Freq: Every day | SUBLINGUAL | Status: DC
  Administered 2019-08-31: 1 via SUBLINGUAL
  Filled 2019-08-29 (×2): qty 1

## 2019-08-29 MED ORDER — ACETAMINOPHEN 650 MG RE SUPP: 650.0000 mg | Freq: Four times a day (QID) | RECTAL | Status: DC | PRN

## 2019-08-29 MED ORDER — DOCUSATE SODIUM 100 MG PO CAPS
100.0000 mg | ORAL_CAPSULE | Freq: Two times a day (BID) | ORAL | Status: DC
  Administered 2019-08-31: 100 mg via ORAL
  Filled 2019-08-29: qty 1

## 2019-08-29 MED ORDER — LORAZEPAM 2 MG/ML IJ SOLN
1.0000 mg | Freq: Once | INTRAMUSCULAR | Status: AC
  Administered 2019-08-29: 09:00:00 1 mg via INTRAVENOUS
  Filled 2019-08-29: qty 1

## 2019-08-29 MED ORDER — ADULT MULTIVITAMIN W/MINERALS CH
1.0000 | ORAL_TABLET | Freq: Every day | ORAL | Status: DC
  Administered 2019-08-30 – 2019-08-31 (×2): 1 via ORAL
  Filled 2019-08-29 (×3): qty 1

## 2019-08-29 NOTE — ED Notes (Signed)
Pt provided supper tray.  

## 2019-08-29 NOTE — ED Provider Notes (Signed)
[facility] Emergency Department Provider Note       Time seen: ----------------------------------------- 8:02 AM on 08/29/2019 -----------------------------------------   I have reviewed the triage vital signs and the nursing notes.  HISTORY   Chief Complaint Abscess   HPI Amanda [name] is a 28 y.o. female with a history of anxiety, kidney stones, ovarian cyst, UTI, substance abuse, IV drug abuse who presents to the ED for abscess to the right foot for the past 2 days.  Foot was noted to be red and swollen.  Patient reports having had a similar reaction on the left leg in the past.  Patient does inject, states she shot up in her right foot and missed.  She is complaining of 10 out of 10 pain in her right foot.  She has had a fever.  Past Medical History:  Diagnosis Date  . Anxiety   . Kidney stones   . Ovarian cyst   . Pelvic pain 05/08/2013  . Urinary tract infection     Patient Active Problem List   Diagnosis Date Noted  . Excessive body weight loss 03/15/2018  . Diarrhea 03/15/2018  . Hx of substance abuse (HCC) 03/15/2018  . TOBACCO ABUSE 09/04/2007    Past Surgical History:  Procedure Laterality Date  . TONSILLECTOMY      Allergies Patient has no known allergies.  Social History Social History   Tobacco Use  . Smoking status: Current Every Day Smoker    Packs/day: 1.00    Types: Cigarettes  . Smokeless tobacco: Never Used  Substance Use Topics  . Alcohol use: Yes  . Drug use: Yes    Review of Systems Constitutional: Positive for fevers, body aches Cardiovascular: Negative for chest pain. Respiratory: Negative for shortness of breath. Gastrointestinal: Negative for abdominal pain, vomiting and diarrhea. Musculoskeletal: Negative for back pain. Skin: Positive for redness, abscess Neurological: Negative for headaches, positive for weakness  All systems negative/normal/unremarkable except as stated in the  HPI  ____________________________________________   PHYSICAL EXAM:  VITAL SIGNS: ED Triage Vitals  Enc Vitals Group     BP 08/29/19 0756 112/69     Pulse Rate 08/29/19 0756 (!) 106     Resp 08/29/19 0756 19     Temp 08/29/19 0756 98.7 F (37.1 C)     Temp Source 08/29/19 0756 Oral     SpO2 08/29/19 0756 97 %     Weight 08/29/19 0752 110 lb 14.3 oz (50.3 kg)     Height 08/29/19 0758 5\' 5"  (1.651 m)     Head Circumference --      Peak Flow --      Pain Score 08/29/19 0751 10     Pain Loc --      Pain Edu? --      Excl. in GC? --    Constitutional: Alert and oriented.  Moderate distress Eyes: Conjunctivae are normal. Normal extraocular movements. ENT      Head: Normocephalic and atraumatic.      Nose: No congestion/rhinnorhea.      Mouth/Throat: Mucous membranes are moist.      Neck: No stridor. Cardiovascular: Normal rate, regular rhythm. No murmurs, rubs, or gallops. Respiratory: Normal respiratory effort without tachypnea nor retractions. Breath sounds are clear and equal bilaterally. No wheezes/rales/rhonchi. Gastrointestinal: Soft and nontender. Normal bowel sounds Musculoskeletal: Nontender with normal range of motion in extremities. No lower extremity tenderness nor edema. Neurologic:  Normal speech and language. No gross focal neurologic deficits are appreciated.  Skin: Extensive IV drug injection sites are noted on the upper and lower extremities, there are areas of erythema on the upper extremities.  On the dorsum of the right foot there is an abscess, right foot is edematous, erythema extends from the foot to the mid tibia. Psychiatric: Bizarre mood and affect at times ____________________________________________  ED COURSE:  As part of my medical decision making, I reviewed the following data within the [place] History obtained from family if available, nursing notes, old chart and ekg, as well as notes from prior ED visits. Patient presented  for possible sepsis, cellulitis and abscess from IV drug abuse, we will assess with labs and imaging as indicated at this time.   Marland Kitchen.Incision and Drainage  Date/Time: 08/29/2019 9:04 AM Performed by: Earleen Newport, MD Authorized by: Earleen Newport, MD   Consent:    Consent obtained:  Emergent situation Location:    Type:  Abscess   Size:  3cm   Location:  Lower extremity   Lower extremity location:  Foot   Foot location:  R foot Pre-procedure details:    Skin preparation:  Betadine Anesthesia (see MAR for exact dosages):    Anesthesia method:  Local infiltration   Local anesthetic:  Lidocaine 1% WITH epi Procedure type:    Complexity:  Simple Procedure details:    Incision types:  Single straight   Incision depth:  Subcutaneous   Scalpel blade:  11   Wound management:  Probed and deloculated   Drainage amount:  Moderate   Wound treatment:  Drain placed   Packing materials:  1/4 in gauze Post-procedure details:    Patient tolerance of procedure:  Tolerated with difficulty    Amanda [name] was evaluated in Emergency Department on 08/29/2019 for the symptoms described in the history of present illness. She was evaluated in the context of the global COVID-19 pandemic, which necessitated consideration that the patient might be at risk for infection with the SARS-CoV-2 virus that causes COVID-19. Institutional protocols and algorithms that pertain to the evaluation of patients at risk for COVID-19 are in a state of rapid change based on information released by regulatory bodies including the CDC and federal and state organizations. These policies and algorithms were followed during the patient's care in the ED.  ____________________________________________   LABS (pertinent positives/negatives)  Labs Reviewed  COMPREHENSIVE METABOLIC PANEL - Abnormal; Notable for the following components:      Result Value   Sodium 133 (*)    Potassium 2.9 (*)    Glucose, Bld 107  (*)    Calcium 8.6 (*)    AST 131 (*)    ALT 158 (*)    All other components within normal limits  CBC WITH DIFFERENTIAL/PLATELET - Abnormal; Notable for the following components:   Hemoglobin 11.8 (*)    HCT 35.7 (*)    Platelets 126 (*)    All other components within normal limits  CULTURE, BLOOD (ROUTINE X 2)  CULTURE, BLOOD (ROUTINE X 2)  URINE CULTURE  AEROBIC/ANAEROBIC CULTURE (SURGICAL/DEEP WOUND)  SARS CORONAVIRUS 2 (TAT 6-24 HRS)  LACTIC ACID, PLASMA  BLOOD GAS, VENOUS  ETHANOL  LACTIC ACID, PLASMA  URINALYSIS, COMPLETE (UACMP) WITH MICROSCOPIC  URINE DRUG SCREEN, QUALITATIVE ([facility] ONLY)  POC URINE PREG, ED   CRITICAL CARE Performed by: Laurence Aly   Total critical care time: 30 minutes  Critical care time was exclusive of separately billable procedures and treating other patients.  Critical care was  necessary to treat or prevent imminent or life-threatening deterioration.  Critical care was time spent personally by me on the following activities: development of treatment plan with patient and/or surrogate as well as nursing, discussions with consultants, evaluation of patient's response to treatment, examination of patient, obtaining history from patient or surrogate, ordering and performing treatments and interventions, ordering and review of laboratory studies, ordering and review of radiographic studies, pulse oximetry and re-evaluation of patient's condition.  RADIOLOGY Images were viewed by me  Chest x-ray, right foot x-ray IMPRESSION: Prominent soft tissue swelling over the dorsum of the midfoot and forefoot with a few tiny bubbles of gas in the superficial soft tissues dorsal to the naviculocuneiform joint. Findings may represent skin ulceration versus an abscess. No underlying osseous abnormality to suggest osteomyelitis. IMPRESSION: Normal chest.  ____________________________________________   DIFFERENTIAL DIAGNOSIS   Abscess,  cellulitis, sepsis, osteomyelitis, IV drug abuse  FINAL ASSESSMENT AND PLAN  Abscess, cellulitis, IV drug abuse, involuntary commitment   Plan: The patient had presented for severe right foot pain and swelling from IV drug abuse. Patient's labs were normal with the exception of elevated liver transaminases. Patient's imaging did reveal soft tissue swelling over the foot but no obvious osteomyelitis.  She was given Vanco and Zosyn.  I did obtain wound cultures.  She has been involuntarily committed.  Appears to be a severe IV drug abuser.   Ulice Dash, MD    Note: This note was generated in part or whole with voice recognition software. Voice recognition is usually quite accurate but there are transcription errors that can and very often do occur. I apologize for any typographical errors that were not detected and corrected.     Emily Filbert, MD 08/29/19 [phone]

## 2019-08-29 NOTE — ED Notes (Signed)
Pt states to this RN that she wants to leave, pt requesting to her family member/friend at bedside that he take her home. This RN explained to friend and patient that if patient did not stay for IV abx that patient was at risk for losing her foot. Pt's friend states understanding. Pt given more warm blankets for comfort. EDP made aware that patient wants to leave.

## 2019-08-29 NOTE — BH Assessment (Signed)
Pt asleep, unable to arouse to participate in assessment. Will attempt to re-assess pt at a later time.

## 2019-08-29 NOTE — ED Notes (Signed)
This RN attempted to call report.  

## 2019-08-29 NOTE — Care Management (Addendum)
IV drug use; apparently has 29 year old at home. Patient is not sure "where" child it located. Message left for contact listed at mother to call RNCM. Patient is believed to be in withdrawal. RNCM spoke with patient to find out where child was located. She assured me that her "Oran Rein [phone]" has her child and child is safe". Patient began to cry and request that get her out of here. She said she will never use again and this time was just related to stress over job loss. She request home health for wound care however depending on cultures she may need IV antibiotics which will concern me if home IV antibiotics are needed. TOC team to follow. Message left at number provided for "[employer]".  Update at 1553: Mom Claiborne Billings called [place] back [phone]. She wanted me to call CPS however she confirms that child is safe at all time and that patient and child live with Oran Rein. Claiborne Billings shared that patient has been in and out of drug treatment centers since she was 28 years old. She was clean for one year. I explained that she could call CPS however she didn't want to do that. I offered [employer] and she will research that.

## 2019-08-29 NOTE — ED Notes (Signed)
Psych and TTS at bedside. 

## 2019-08-29 NOTE — Consult Note (Signed)
[facility] Face-to-Face Psychiatry Consult   Reason for Consult: Abscess Referring Physician: Dr. Mayford Knife Patient Identification: Amanda [name] MRN:  [id number] Principal Diagnosis: Abscess of right foot Diagnosis:  Principal Problem:   Abscess of right foot Active Problems:   TOBACCO ABUSE   Hx of substance abuse (HCC)   IV drug abuse (HCC)   Anxiety   Foot abscess   Total Time spent with patient: 1 hour  Subjective: "Just let me get out of here so I can get my Suboxone." Amanda [name] is a 28 y.o. female patient presented to [facility] ED via POV voluntary initially. The patient is seen due to her presenting with right foot abscess related to IV drug abuse. The patient presented to the ED for an abscess to the right foot for the past two days.  Due to her injecting into her right foot.  The patient discussed that she is currently on Suboxone therapy and requests to be discharged to get home and take her medication.  Per the ED triage nursing note, the patient is consistently screaming out and thrashing intermittently.  The patient was eventually placed under involuntary commitment status (IVC) due to her belligerent, aggressive, and threatening to leave behaviors.  The patient was seen face-to-face by this provider; chart reviewed and consulted with Dr. Erma Heritage on 08/29/2019 due to the care of the patient. EDP discussed that the patient does not meet the criteria to be admitted to the psychiatric inpatient unit.  The patient could benefit from a substance abuse detox program.  The patient is alert and oriented x 3, angry, emotional, and uncooperative, and mood-congruent with affect on evaluation. The patient does appear to be responding to internal or external stimuli. Neither is the patient presenting with any delusional thinking. The patient denies auditory or visual hallucinations. The patient denies suicidal, homicidal, or self-harm ideations. The patient is not presenting with any psychotic or  paranoid behaviors. During an encounter with the patient, she was able to answer most questions appropriately.  Plan: The patient is not a safety risk to self or others and does not require psychiatric inpatient admission for stabilization and treatment. This patient could benefit from substance abuse detox treatment.   HPI: Per Dr. Mayford Knife; [name] is a 28 y.o. female with a history of anxiety, kidney stones, ovarian cyst, UTI, substance abuse, IV drug abuse who presents to the ED for abscess to the right foot for the past 2 days.  Foot was noted to be red and swollen.  Patient reports having had a similar reaction on the left leg in the past.  Patient does inject, states she shot up in her right foot and missed.  She is complaining of 10 out of 10 pain in her right foot.  She has had a fever.  Past Psychiatric History: History reviewed. No pertinent past psychiatric history   Risk to Self:  No Risk to Others:  No Prior Inpatient Therapy:  No Prior Outpatient Therapy:  No  Past Medical History:  Past Medical History:  Diagnosis Date  . Anxiety   . Kidney stones   . Ovarian cyst   . Pelvic pain 05/08/2013  . Urinary tract infection     Past Surgical History:  Procedure Laterality Date  . TONSILLECTOMY     Family History:  Family History  Problem Relation Age of Onset  . Bipolar disorder Mother   . Anxiety disorder Mother    Family Psychiatric  History: Unknown Social History:  Social  History   Substance and Sexual Activity  Alcohol Use Yes     Social History   Substance and Sexual Activity  Drug Use Yes    Social History   Socioeconomic History  . Marital status: Single    Spouse name: Not on file  . Number of children: Not on file  . Years of education: Not on file  . Highest education level: Not on file  Occupational History  . Not on file  Social Needs  . Financial resource strain: Not on file  . Food insecurity    Worry: Not on file     Inability: Not on file  . Transportation needs    Medical: Not on file    Non-medical: Not on file  Tobacco Use  . Smoking status: Current Every Day Smoker    Packs/day: 1.00    Types: Cigarettes  . Smokeless tobacco: Never Used  Substance and Sexual Activity  . Alcohol use: Yes  . Drug use: Yes  . Sexual activity: Yes    Birth control/protection: None  Lifestyle  . Physical activity    Days per week: 4 days    Minutes per session: 60 min  . Stress: To some extent  Relationships  . Social Musician on phone: Not on file    Gets together: Not on file    Attends religious service: Not on file    Active member of club or organization: Not on file    Attends meetings of clubs or organizations: Not on file    Relationship status: Not on file  Other Topics Concern  . Not on file  Social History Narrative  . Not on file   Additional Social History:    Allergies:  No Known Allergies  Labs:  Results for orders placed or performed during the hospital encounter of 08/29/19 (from the past 48 hour(s))  Lactic acid, plasma     Status: None   Collection Time: 08/29/19  8:02 AM  Result Value Ref Range   Lactic Acid, Venous 0.8 0.5 - 1.9 mmol/L    Comment: Performed at [facility], [address] [place], [place] [address]  Blood gas, venous ([facility], [facility], [facility])     Status: None   Collection Time: 08/29/19  8:02 AM  Result Value Ref Range   pH, Ven 7.40 7.250 - 7.430   pCO2, Ven 44 44.0 - 60.0 mmHg   pO2, Ven 34.0 32.0 - 45.0 mmHg   Bicarbonate 27.3 20.0 - 28.0 mmol/L   Acid-Base Excess 2.0 0.0 - 2.0 mmol/L   O2 Saturation 65.5 %   Patient temperature 37.0    Collection site VEIN    Sample type VENIPUNCTURE     Comment: Performed at [facility], [address] [place], [place] [address]  Comprehensive metabolic panel     Status: Abnormal   Collection Time: 08/29/19  8:11 AM  Result Value Ref Range   Sodium 133 (L) 135 - 145 mmol/L   Potassium  2.9 (L) 3.5 - 5.1 mmol/L   Chloride 99 98 - 111 mmol/L   CO2 25 22 - 32 mmol/L   Glucose, Bld 107 (H) 70 - 99 mg/dL   BUN 11 6 - 20 mg/dL   Creatinine, Ser 0.98 0.44 - 1.00 mg/dL   Calcium 8.6 (L) 8.9 - 10.3 mg/dL   Total Protein 7.9 6.5 - 8.1 g/dL   Albumin 3.9 3.5 - 5.0 g/dL   AST 119 (H) 15 -  41 U/L   ALT 158 (H) 0 - 44 U/L   Alkaline Phosphatase 89 38 - 126 U/L   Total Bilirubin 1.1 0.3 - 1.2 mg/dL   GFR calc non Af Amer >60 >60 mL/min   GFR calc Af Amer >60 >60 mL/min   Anion gap 9 5 - 15    Comment: Performed at [facility], [address] [place], [place] [address]  CBC WITH DIFFERENTIAL     Status: Abnormal   Collection Time: 08/29/19  8:11 AM  Result Value Ref Range   WBC 4.9 4.0 - 10.5 K/uL   RBC 4.36 3.87 - 5.11 MIL/uL   Hemoglobin 11.8 (L) 12.0 - 15.0 g/dL   HCT 38.1 (L) 82.9 - 93.7 %   MCV 81.9 80.0 - 100.0 fL   MCH 27.1 26.0 - 34.0 pg   MCHC 33.1 30.0 - 36.0 g/dL   RDW 16.9 67.8 - 93.8 %   Platelets 126 (L) 150 - 400 K/uL   nRBC 0.0 0.0 - 0.2 %   Neutrophils Relative % 50 %   Neutro Abs 2.4 1.7 - 7.7 K/uL   Lymphocytes Relative 39 %   Lymphs Abs 1.9 0.7 - 4.0 K/uL   Monocytes Relative 8 %   Monocytes Absolute 0.4 0.1 - 1.0 K/uL   Eosinophils Relative 3 %   Eosinophils Absolute 0.1 0.0 - 0.5 K/uL   Basophils Relative 0 %   Basophils Absolute 0.0 0.0 - 0.1 K/uL   Immature Granulocytes 0 %   Abs Immature Granulocytes 0.02 0.00 - 0.07 K/uL    Comment: Performed at [facility], [address] [place], [place] [address]  Blood Culture (routine x 2)     Status: None (Preliminary result)   Collection Time: 08/29/19  8:13 AM   Specimen: BLOOD  Result Value Ref Range   Specimen Description BLOOD LEFT ARM    Special Requests      BOTTLES DRAWN AEROBIC AND ANAEROBIC Blood Culture results may not be optimal due to an excessive volume of blood received in culture bottles   Culture      NO GROWTH < 12 HOURS Performed at [facility],  [address] [place], [place] [address]    Report Status PENDING   Blood Culture (routine x 2)     Status: None (Preliminary result)   Collection Time: 08/29/19  8:13 AM   Specimen: BLOOD  Result Value Ref Range   Specimen Description BLOOD LEFT FOREARM    Special Requests      BOTTLES DRAWN AEROBIC AND ANAEROBIC Blood Culture adequate volume   Culture      NO GROWTH < 12 HOURS Performed at [facility], [address] [place], [place] [address]    Report Status PENDING   Aerobic/Anaerobic Culture (surgical/deep wound)     Status: None (Preliminary result)   Collection Time: 08/29/19  8:16 AM   Specimen: Foot; Abscess  Result Value Ref Range   Specimen Description      FOOT Performed at [facility], [address], [place], [place] [address]    Special Requests      Normal Performed at [facility], [address] [place], [place] [address]    Gram Stain      RARE WBC PRESENT, PREDOMINANTLY PMN FEW GRAM POSITIVE COCCI IN PAIRS Performed at [facility], [address], [place], [place] [address]    Culture PENDING    Report Status PENDING  Ethanol     Status: None   Collection Time: 08/29/19  8:16 AM  Result Value Ref Range   Alcohol, Ethyl (B) <10 <10 mg/dL    Comment: (NOTE) Lowest detectable limit for serum alcohol is 10 mg/dL. For medical purposes only. Performed at [facility], [address] [place], [place] [address]   SARS CORONAVIRUS 2 (TAT 6-24 HRS) Nasopharyngeal Nasopharyngeal Swab     Status: None   Collection Time: 08/29/19  9:50 AM   Specimen: Nasopharyngeal Swab  Result Value Ref Range   SARS Coronavirus 2 NEGATIVE NEGATIVE    Comment: (NOTE) SARS-CoV-2 target nucleic acids are NOT DETECTED. The SARS-CoV-2 RNA is generally detectable in upper and lower respiratory specimens during the acute phase of infection. Negative results do not preclude SARS-CoV-2 infection, do not rule  out co-infections with other pathogens, and should not be used as the sole basis for treatment or other patient management decisions. Negative results must be combined with clinical observations, patient history, and epidemiological information. The expected result is Negative. Fact Sheet for Patients: HairSlick.nohttps://www.fda.gov/media/138098/download Fact Sheet for Healthcare Providers: quierodirigir.comhttps://www.fda.gov/media/138095/download This test is not yet approved or cleared by the [place] FDA and  has been authorized for detection and/or diagnosis of SARS-CoV-2 by FDA under an Emergency Use Authorization (EUA). This EUA will remain  in effect (meaning this test can be used) for the duration of the COVID-19 declaration under Section 56 4(b)(1) of the Act, 21 U.S.C. section 360bbb-3(b)(1), unless the authorization is terminated or revoked sooner. Performed at [facility], [address] [place], [place] [address]   Urinalysis, Complete w Microscopic     Status: Abnormal   Collection Time: 08/29/19  2:02 PM  Result Value Ref Range   Color, Urine YELLOW (A) YELLOW   APPearance CLEAR (A) CLEAR   Specific Gravity, Urine 1.012 1.005 - 1.030   pH 6.0 5.0 - 8.0   Glucose, UA NEGATIVE NEGATIVE mg/dL   Hgb urine dipstick SMALL (A) NEGATIVE   Bilirubin Urine NEGATIVE NEGATIVE   Ketones, ur NEGATIVE NEGATIVE mg/dL   Protein, ur NEGATIVE NEGATIVE mg/dL   Nitrite NEGATIVE NEGATIVE   Leukocytes,Ua TRACE (A) NEGATIVE   RBC / HPF 0-5 0 - 5 RBC/hpf   WBC, UA 21-50 0 - 5 WBC/hpf   Bacteria, UA NONE SEEN NONE SEEN   Squamous Epithelial / LPF 0-5 0 - 5   Mucus PRESENT     Comment: Performed at [facility], [address] [place], [place] [address]  Urine Drug Screen, Qualitative ([facility] only)     Status: Abnormal   Collection Time: 08/29/19  2:02 PM  Result Value Ref Range   Tricyclic, Ur Screen NONE DETECTED NONE DETECTED   Amphetamines, Ur Screen POSITIVE (A) NONE DETECTED   MDMA  (Ecstasy)Ur Screen NONE DETECTED NONE DETECTED   Cocaine Metabolite,Ur [place] POSITIVE (A) NONE DETECTED   Opiate, Ur Screen POSITIVE (A) NONE DETECTED   Phencyclidine (PCP) Ur S NONE DETECTED NONE DETECTED   Cannabinoid 50 Ng, Ur [place] NONE DETECTED NONE DETECTED   Barbiturates, Ur Screen NONE DETECTED NONE DETECTED   Benzodiazepine, Ur Scrn POSITIVE (A) NONE DETECTED   Methadone Scn, Ur NONE DETECTED NONE DETECTED    Comment: (NOTE) Tricyclics + metabolites, urine    Cutoff 1000 ng/mL Amphetamines + metabolites, urine  Cutoff 1000 ng/mL MDMA (Ecstasy), urine              Cutoff 500 ng/mL Cocaine Metabolite, urine  Cutoff 300 ng/mL Opiate + metabolites, urine        Cutoff 300 ng/mL Phencyclidine (PCP), urine         Cutoff 25 ng/mL Cannabinoid, urine                 Cutoff 50 ng/mL Barbiturates + metabolites, urine  Cutoff 200 ng/mL Benzodiazepine, urine              Cutoff 200 ng/mL Methadone, urine                   Cutoff 300 ng/mL The urine drug screen provides only a preliminary, unconfirmed analytical test result and should not be used for non-medical purposes. Clinical consideration and professional judgment should be applied to any positive drug screen result due to possible interfering substances. A more specific alternate chemical method must be used in order to obtain a confirmed analytical result. Gas chromatography / mass spectrometry (GC/MS) is the preferred confirmat ory method. Performed at [facility], [address] [place], [place] [address]   Pregnancy, urine     Status: None   Collection Time: 08/29/19  2:02 PM  Result Value Ref Range   Preg Test, Ur NEGATIVE NEGATIVE    Comment: Performed at [facility], [address] [place], [place] [address]    Current Facility-Administered Medications  Medication Dose Route Frequency Provider Last Rate Last Dose  . 0.9 %  sodium chloride infusion   Intravenous Continuous Pokhrel, Laxman, MD       . 0.9 %  sodium chloride infusion  250 mL Intravenous PRN Pokhrel, Laxman, MD      . acetaminophen (TYLENOL) tablet 650 mg  650 mg Oral Q6H PRN Pokhrel, Laxman, MD       Or  . acetaminophen (TYLENOL) suppository 650 mg  650 mg Rectal Q6H PRN Pokhrel, Laxman, MD      . bisacodyl (DULCOLAX) suppository 10 mg  10 mg Rectal Daily PRN Pokhrel, Laxman, MD      . buprenorphine-naloxone (SUBOXONE) 8-2 mg per SL tablet 1 tablet  1 tablet Sublingual Daily Pokhrel, Laxman, MD      . docusate sodium (COLACE) capsule 100 mg  100 mg Oral BID Pokhrel, Laxman, MD      . enoxaparin (LOVENOX) injection 40 mg  40 mg Subcutaneous Q24H Pokhrel, Laxman, MD      . folic acid (FOLVITE) tablet 1 mg  1 mg Oral Daily Pokhrel, Laxman, MD      . HYDROcodone-acetaminophen (NORCO/VICODIN) 5-325 MG per tablet 1-2 tablet  1-2 tablet Oral Q4H PRN Pokhrel, Laxman, MD      . HYDROmorphone (DILAUDID) injection 1 mg  1 mg Intravenous Q3H PRN Pokhrel, Laxman, MD   1 mg at 08/29/19 2057  . hydrOXYzine (ATARAX/VISTARIL) tablet 25-50 mg  25-50 mg Oral Q6H PRN Pokhrel, Laxman, MD      . LORazepam (ATIVAN) tablet 1-4 mg  1-4 mg Oral Q1H PRN Pokhrel, Laxman, MD       Or  . LORazepam (ATIVAN) injection 1-4 mg  1-4 mg Intravenous Q1H PRN Pokhrel, Laxman, MD   1 mg at 08/29/19 2057  . multivitamin with minerals tablet 1 tablet  1 tablet Oral Daily Pokhrel, Laxman, MD      . nicotine (NICODERM CQ - dosed in mg/24 hours) patch 21 mg  21 mg Transdermal Daily Pokhrel, Laxman, MD   21 mg at 08/29/19 1317  . ondansetron (ZOFRAN) tablet 4 mg  4 mg Oral Q6H  PRN Pokhrel, Laxman, MD       Or  . ondansetron (ZOFRAN) injection 4 mg  4 mg Intravenous Q6H PRN Pokhrel, Laxman, MD      . piperacillin-tazobactam (ZOSYN) IVPB 3.375 g  3.375 g Intravenous Q8H Dorothea Ogle B, RPH 12.5 mL/hr at 08/29/19 2102 3.375 g at 08/29/19 2102  . polyethylene glycol (MIRALAX / GLYCOLAX) packet 17 g  17 g Oral Daily PRN Pokhrel, Laxman, MD      . sodium chloride  flush (NS) 0.9 % injection 3 mL  3 mL Intravenous Q12H Pokhrel, Laxman, MD      . sodium chloride flush (NS) 0.9 % injection 3 mL  3 mL Intravenous PRN Pokhrel, Laxman, MD      . thiamine (VITAMIN B-1) tablet 100 mg  100 mg Oral Daily Pokhrel, Laxman, MD       Or  . thiamine (B-1) injection 100 mg  100 mg Intravenous Daily Pokhrel, Laxman, MD      . vancomycin (VANCOCIN) IVPB 750 mg/150 ml premix  750 mg Intravenous Q12H Tressie Ellis, [address] [facility]       Current Outpatient Medications  Medication Sig Dispense Refill  . buprenorphine (SUBUTEX) 8 MG SUBL SL tablet TK 1 T PO BID  0  . hydrOXYzine (ATARAX/VISTARIL) 25 MG tablet Take 25-50 mg by mouth every 6 (six) hours as needed.      Musculoskeletal: Strength & Muscle Tone: decreased Gait & Station: unsteady Patient leans: N/A  Psychiatric Specialty Exam: Physical Exam  Nursing note and vitals reviewed. Constitutional: She is oriented to person, place, and time.  Neck: Normal range of motion. Neck supple.  Cardiovascular: Normal rate.  Respiratory: Effort normal.  Musculoskeletal:        General: Tenderness present.  Neurological: She is alert and oriented to person, place, and time.    Review of Systems  Psychiatric/Behavioral: Positive for substance abuse. The patient is nervous/anxious.   All other systems reviewed and are negative.   Blood pressure 118/76, pulse 83, temperature 98.7 F (37.1 C), temperature source Oral, resp. rate 18, height  (1.651 m), weight 61.2 kg, SpO2 99 %.Body mass index is 22.47 kg/m.  General Appearance: Disheveled  Eye Contact:  Good  Speech:  Pressured  Volume:  Increased  Mood:  Angry, Anxious and Irritable  Affect:  Congruent  Thought Process:  Coherent  Orientation:  Full (Time, Place, and Person)  Thought Content:  Logical  Suicidal Thoughts:  No  Homicidal Thoughts:  No  Memory:  Immediate;   Good Recent;   Good Remote;   Good  Judgement:  Impaired  Insight:  Lacking  Psychomotor  Activity:  Normal  Concentration:  Concentration: Fair and Attention Span: Fair  Recall:  [employer] of Knowledge:  Fair  Language:  Fair  Akathisia:  Negative  Handed:  Right  AIMS (if indicated):     Assets:  Communication Skills Resilience Social Support  ADL's:  Intact  Cognition:  WNL  Sleep:        Treatment Plan Summary: Plan Patient does not meet criteria for psychiatric inpatient admission.  This patient could benefit from substance abuse detox treatment.  Disposition: No evidence of imminent risk to self or others at present.   Patient does not meet criteria for psychiatric inpatient admission. Supportive therapy provided about ongoing stressors. Refer to IOP.  Gillermo Murdoch, NP 08/29/2019 10:37 PM

## 2019-08-29 NOTE — ED Notes (Signed)
Pt noted to be resting in bed with eyes closed, respirations even and unlabored at this time. This RN spoke with Ignatius Specking, Agricultural consultant, and Dr. Jimmye Norman, due to to patient's IVC status at this time, pt will be moved to room 25 for best visual access.

## 2019-08-29 NOTE — ED Notes (Signed)
This RN tried to encourage pt to take vitamins and meds. Pt refused all stating, "I am only taking them if I get discharged now!"

## 2019-08-29 NOTE — ED Notes (Signed)
Pt ambulatory to toilet with this RN. Pt yelling about her friend, her cell phone, and her kids. Pt speech slurred. Pt states she is withdrawing. No diaphoresis, no vomiting, no shaking noted. Pt states she is not staying. EDP informed and in room to inform pt she is being admitted and is under IVC. Pt continues to yell from room.

## 2019-08-29 NOTE — ED Notes (Signed)
Pt continues to yell from room. Stating "I need my medicine, I'm going through withdrawal." pt has found purse and is texting at this time. Will change pt into psych attire with assistance.

## 2019-08-29 NOTE — ED Triage Notes (Signed)
Abcess to right foot x 2 days.  Foot is reddened and swollen.

## 2019-08-29 NOTE — ED Notes (Signed)
Pt continues to yell from room, throwing objects. Thrashing around on bed. Pt screaming for pain medication. EDP informed and at bedside.

## 2019-08-29 NOTE — Consult Note (Signed)
  Attempted to interview patient however patient was sedated due to recent medication administration and cannot be interviewed at this time.  Per ED provider, patient presented  intoxicated and  belligerent with a concerning medical status.  Patient was placed under IVC because she did represent a danger to herself at that time.  I&D has since been completed patient will be admitted to medical floors for antibiotic administration.  Psychiatry will attempt to reassess at a later point.

## 2019-08-29 NOTE — Consult Note (Signed)
Pharmacy Antibiotic Note  Amanda [name] is a 28 y.o. female with history of IVDU, tobacco abuse admitted on 08/29/2019 with foot abscess. I&D performed in ED. CXR normal impression. Foot imaging impression of ulceration vs abscess and not c/f osteomyelitis. Wound cultures pending. Pharmacy has been consulted for vancomycin and pip/tazo dosing.   Patient has been afebrile last 24h. WBC 4.9 with no left shift on differential. Patient has received vancomycin 1 g and pip tazo 3.375 g so far this admission.   Plan: Piperacillin/tazobactam 3.375 q8h extended infusion  Vancomycin 750 mg IV Q 12 hrs. Goal AUC 400-550. Expected AUC: 415.7, trough 11.1 SCr used: 0.8  Continue to monitor for nephrotoxicity while on this antibiotic combination. Daily Scr per protocol while on vancomycin. Follow-up wound cultures when available.   Height: 5\' 5"  (165.1 cm) Weight: 135 lb (61.2 kg) IBW/kg (Calculated) : 57  Temp (24hrs), Avg:98.7 F (37.1 C), Min:98.7 F (37.1 C), Max:98.7 F (37.1 C)  Recent Labs  Lab 08/29/19 0802 08/29/19 0811  WBC  --  4.9  CREATININE  --  0.54  LATICACIDVEN 0.8  --     Estimated Creatinine Clearance: 93.4 mL/min (by C-G formula based on SCr of 0.54 mg/dL).    No Known Allergies  Antimicrobials this admission: Vancomycin 11/11 >>  Pip/tazo 11/11 >>   Dose adjustments this admission: n/a  Microbiology results: 11/11 Wound cx: pending 11/11 Ucx: pending 11/11 BCx: no growth <12h 11/11 COVID-19: pending  Thank you for allowing pharmacy to be a part of this patient's care.  [name] 08/29/2019 3:23 PM

## 2019-08-29 NOTE — H&P (Addendum)
[facility] History and Physical  Amanda [name] JQB:[id number] DOB: 11-19-1990 DOA: 08/29/2019  Referring physician: ED  PCP: [facility]   Chief Complaint: Right foot pain x 2 days  Limited history since the patient was completely sedated at the time of my examination.  History obtained from the medical records and ED provider.  HPI: Amanda [name] is a 28 y.o. female with a history of anxiety, IV drug abuse with heroin presented to hospital with pain redness and swelling of the right foot.  Patient had stated that this had been going on for last 2 days.  She had stated that she missed the vein during injecting in the foot and subsequently started having redness swelling tenderness..  In the ED initially she was complaining of 10/ 10 pain in her right foot with fever.  I was unable to get the rest of the information.  ED Course: In the ED, patient had a bizarre behavior and was a danger to herself and she was IVCed.  Psychiatry was also consulted.  She did not have a fever but was mildly tachycardic.  WBC was of 4.9.  Lactate was 0.8.  Patient received broad-spectrum antibiotic with vancomycin and Zosyn.  I&D was done by the ED provider at bedside.  Patient was then considered for admission to the hospital for broad-spectrum antibiotics.  Patient also received IV potassium 40 mEq and magnesium sulfate 2 g x 1 in the ED.   Review of Systems:  Limited due to patient's condition  Past Medical History:  Diagnosis Date   Anxiety    Kidney stones    Ovarian cyst    Pelvic pain 05/08/2013   Urinary tract infection    Past Surgical History:  Procedure Laterality Date   TONSILLECTOMY      Social History:  reports that she has been smoking cigarettes. She has been smoking about 1.00 pack per day. She has never used smokeless tobacco. She reports current alcohol use. She reports current drug use.  No Known Allergies  Family History  Problem Relation Age of Onset    Bipolar disorder Mother    Anxiety disorder Mother      Prior to Admission medications   Medication Sig Start Date End Date Taking? Authorizing Provider  buprenorphine (SUBUTEX) 8 MG SUBL SL tablet TK 1 T PO BID 02/20/18  Yes [provider]  hydrOXYzine (ATARAX/VISTARIL) 25 MG tablet Take 25-50 mg by mouth every 6 (six) hours as needed. 07/29/19  Yes [provider]    Physical Exam: Vitals:   08/29/19 0756 08/29/19 0758 08/29/19 0830 08/29/19 0900  BP: 112/69  116/86 111/78  Pulse: (!) 106  99 91  Resp: 19  (!) 24 12  Temp: 98.7 F (37.1 C)     TempSrc: Oral     SpO2: 97%  100% 100%  Weight:  61.2 kg    Height:  5\' 5"  (1.651 m)     Wt Readings from Last 3 Encounters:  08/29/19 61.2 kg  07/04/19 50.3 kg  03/15/18 50.8 kg   Body mass index is 22.47 kg/m.  General: Patient sedated, somnolent HENT: Normocephalic, oral mucosa dry.  Pupils reactive.   No scleral pallor or icterus noted. Oral mucosa is moist.  Chest:  Clear breath sounds.  Diminished breath sounds bilaterally. No crackles or wheezes.  CVS: S1 &S2 heard. No murmur.  Regular rate and rhythm. Abdomen: Soft, nontender, nondistended.  Bowel sounds are heard.  Liver is not palpable, no  abdominal mass palpated Extremities: Right dorsal foot erythema, tenderness status post I &D with dressing.  Bilateral hands with mild erythema. Psych: Sedated at the time of my exam CNS: Sedated, moves all extremities Skin: Right foot abscess cellulitis, bilateral hands erythema.  Injection marks.   Labs on Admission:   CBC: Recent Labs  Lab 08/29/19 0811  WBC 4.9  NEUTROABS 2.4  HGB 11.8*  HCT 35.7*  MCV 81.9  PLT 126*    Basic Metabolic Panel: Recent Labs  Lab 08/29/19 0811  NA 133*  K 2.9*  CL 99  CO2 25  GLUCOSE 107*  BUN 11  CREATININE 0.54  CALCIUM 8.6*    Liver Function Tests: Recent Labs  Lab 08/29/19 0811  AST 131*  ALT 158*  ALKPHOS 89  BILITOT 1.1  PROT 7.9  ALBUMIN  3.9   No results for input(s): LIPASE, AMYLASE in the last 168 hours. No results for input(s): AMMONIA in the last 168 hours.  Cardiac Enzymes: No results for input(s): CKTOTAL, CKMB, CKMBINDEX, TROPONINI in the last 168 hours.  BNP (last 3 results) No results for input(s): BNP in the last 8760 hours.  ProBNP (last 3 results) No results for input(s): PROBNP in the last 8760 hours.  CBG: No results for input(s): GLUCAP in the last 168 hours.  Lipase  No results found for: LIPASE   Urinalysis    Component Value Date/Time   COLORURINE YELLOW 03/14/2016 1420   APPEARANCEUR CLOUDY (A) 03/14/2016 1420   APPEARANCEUR Cloudy 05/15/2012 1722   LABSPEC 1.015 03/14/2016 1420   LABSPEC 1.015 05/15/2012 1722   PHURINE 6.0 03/14/2016 1420   GLUCOSEU NEGATIVE 03/14/2016 1420   GLUCOSEU Negative 05/15/2012 1722   HGBUR TRACE (A) 03/14/2016 1420   BILIRUBINUR neg 07/04/2019 1038   BILIRUBINUR Negative 05/15/2012 1722   KETONESUR NEGATIVE 03/14/2016 1420   PROTEINUR Negative 07/04/2019 1038   PROTEINUR NEGATIVE 03/14/2016 1420   UROBILINOGEN 0.2 01/23/2015 2135   NITRITE neg 07/04/2019 1038   NITRITE POSITIVE (A) 03/14/2016 1420   LEUKOCYTESUR Trace (A) 07/04/2019 1038   LEUKOCYTESUR 2+ 05/15/2012 1722     Drugs of Abuse     Component Value Date/Time   LABOPIA NONE DETECTED 01/23/2015 2135   COCAINSCRNUR POSITIVE (A) 01/23/2015 2135   COCAINSCRNUR NEGATIVE 05/15/2012 1722   LABBENZ POSITIVE (A) 01/23/2015 2135   AMPHETMU NONE DETECTED 01/23/2015 2135   THCU POSITIVE (A) 01/23/2015 2135   LABBARB NONE DETECTED 01/23/2015 2135      Radiological Exams on Admission: Dg Chest Port 1 View  Result Date: 08/29/2019 CLINICAL DATA:  Right foot abscess for 2 days.  Cigarette smoker. EXAM: PORTABLE CHEST 1 VIEW COMPARISON:  Single-view of the chest 05/15/2012. FINDINGS: The lungs clear. Heart size normal. No pneumothorax or pleural fluid. No acute or focal bony abnormality. IMPRESSION:  Normal chest. Electronically Signed   By: Drusilla Kannerhomas  Dalessio M.D.   On: 08/29/2019 09:14   Dg Foot Complete Right  Result Date: 08/29/2019 CLINICAL DATA:  Right foot redness and swelling EXAM: RIGHT FOOT COMPLETE - 3+ VIEW COMPARISON:  None. FINDINGS: There is no evidence of fracture or dislocation. No cortical destruction or periostitis. There is no evidence of arthropathy or other focal bone abnormality. There is prominent soft tissue swelling over the dorsum of the midfoot and forefoot with a few tiny bubbles of gas in the superficial soft tissues dorsal to the naviculocuneiform joint. No radiopaque foreign body. IMPRESSION: Prominent soft tissue swelling over the dorsum of the midfoot and  forefoot with a few tiny bubbles of gas in the superficial soft tissues dorsal to the naviculocuneiform joint. Findings may represent skin ulceration versus an abscess. No underlying osseous abnormality to suggest osteomyelitis. Electronically Signed   By: Davina Poke M.D.   On: 08/29/2019 09:14     Assessment/Plan  Right foot abscess.  Status post I&D in the ED.  Continue with vancomycin and Zosyn for now.  Follow abscess cultures sent from the ED.  Follow blood cultures.  Depending upon response, patient might need to be seen by general surgery.  Lactate was 0.8.  Follow blood cultures .  COVID-19 is pending.  Continue narcotics for pain.  Right foot x-ray without any bony abnormality.  Polysubstance abuse.  As per the ED physician, history of IV heroin abuse.  UDS still pending.  Bizarre behavior in the ED.  Patient was IVC by the ED physician. will closely monitor for withdrawals.  Psychiatry was notified from the ED. will be on CIWA protocol.  Tobacco abuse.  We will put the patient on nicotine patch.  Alcohol abuse.  Unknown details on it.  Patient will be on CIWA protocol for now.  Alcohol level less than 10.  Continue thiamine and folic acid.  Abnormal urinalysis.  Nitrite positive.  Patient will  be on antibiotic.  Follow urine culture  History of anxiety.  Will be on Ativan as per CIWA protocol  Significant hypokalemia.  Will replace.  Check levels in a.m.  Check magnesium levels in a.m..  Consultant:  Psychiatry notified from the ED  Code Status: Full code  DVT Prophylaxis: Lovenox  Antibiotics:  Vancomycin and Zosyn IV  Family Communication:  Patients' condition and plan of care including tests being ordered.  Unable to discuss with the patient.    Disposition Plan: Home likely in 2 to 3 days  Severity of Illness: The appropriate patient status for this patient is INPATIENT. Inpatient status is judged to be reasonable and necessary in order to provide the required intensity of service to ensure the patient's safety. The patient's presenting symptoms, physical exam findings, and initial radiographic and laboratory data in the context of their chronic comorbidities is felt to place them at high risk for further clinical deterioration. Furthermore, it is not anticipated that the patient will be medically stable for discharge from the hospital within 2 midnights of admission. I certify that at the point of admission it is my clinical judgment that the patient will require inpatient hospital care spanning beyond 2 midnights from the point of admission due to high intensity of service, high risk for further deterioration and high frequency of surveillance required.   Signed, Flora Lipps, MD [facility] 08/29/2019

## 2019-08-29 NOTE — ED Notes (Signed)
Upon arrival to room pt noted to be consistently screaming out and thrashing intermittently, when left along pt noted to go to sleep with even and unlabored respirations. Pt also noted to be intermittently confused speaking nonsensical words at this time. Pt with large abscess and severe swelling to anterior R foot at this time, states "I shot up and I missed". Pt was noted to be screaming and requesting to be covered up with blankets because "being cold makes it burn".   Pt did not tolerate I&D well, pt noted to scream at top of her lungs, thrashing in bed despite pain medication administered by this RN. Packing applied by Dr. Jimmye Norman, dry dressing applied by this RN.

## 2019-08-29 NOTE — Consult Note (Signed)
CODE SEPSIS - PHARMACY COMMUNICATION  **Broad Spectrum Antibiotics should be administered within 1 hour of Sepsis diagnosis**  Time Code Sepsis Called/Page Received: 0802  Antibiotics Ordered: Vancomycin/Zosyn  Time of 1st antibiotic administration: 0840  Additional action taken by pharmacy: None  If necessary, Name of Provider/Nurse Contacted: [place] ,PharmD Clinical Pharmacist  08/29/2019  8:40 AM

## 2019-08-30 DIAGNOSIS — E440 Moderate protein-calorie malnutrition: Secondary | ICD-10-CM

## 2019-08-30 DIAGNOSIS — L02619 Cutaneous abscess of unspecified foot: Secondary | ICD-10-CM

## 2019-08-30 DIAGNOSIS — F1920 Other psychoactive substance dependence, uncomplicated: Secondary | ICD-10-CM

## 2019-08-30 DIAGNOSIS — F17200 Nicotine dependence, unspecified, uncomplicated: Secondary | ICD-10-CM

## 2019-08-30 DIAGNOSIS — L02611 Cutaneous abscess of right foot: Principal | ICD-10-CM

## 2019-08-30 DIAGNOSIS — L03115 Cellulitis of right lower limb: Secondary | ICD-10-CM

## 2019-08-30 DIAGNOSIS — F419 Anxiety disorder, unspecified: Secondary | ICD-10-CM

## 2019-08-30 DIAGNOSIS — F1911 Other psychoactive substance abuse, in remission: Secondary | ICD-10-CM

## 2019-08-30 LAB — COMPREHENSIVE METABOLIC PANEL
ALT: 120 U/L — ABNORMAL HIGH (ref 0–44)
AST: 91 U/L — ABNORMAL HIGH (ref 15–41)
Albumin: 3 g/dL — ABNORMAL LOW (ref 3.5–5.0)
Alkaline Phosphatase: 83 U/L (ref 38–126)
Anion gap: 10 (ref 5–15)
BUN: 5 mg/dL — ABNORMAL LOW (ref 6–20)
CO2: 18 mmol/L — ABNORMAL LOW (ref 22–32)
Calcium: 8.5 mg/dL — ABNORMAL LOW (ref 8.9–10.3)
Chloride: 112 mmol/L — ABNORMAL HIGH (ref 98–111)
Creatinine, Ser: 0.66 mg/dL (ref 0.44–1.00)
GFR calc Af Amer: >60 mL/min (ref >60)
GFR calc non Af Amer: >60 mL/min (ref >60)
Glucose, Bld: 130 mg/dL — ABNORMAL HIGH (ref 70–99)
Potassium: 3.6 mmol/L (ref 3.5–5.1)
Sodium: 140 mmol/L (ref 135–145)
Total Bilirubin: 0.7 mg/dL (ref 0.3–1.2)
Total Protein: 6.6 g/dL (ref 6.5–8.1)

## 2019-08-30 LAB — CBC
HCT: 30.7 % — ABNORMAL LOW (ref 36.0–46.0)
Hemoglobin: 10.4 g/dL — ABNORMAL LOW (ref 12.0–15.0)
MCH: 27.2 pg (ref 26.0–34.0)
MCHC: 33.9 g/dL (ref 30.0–36.0)
MCV: 80.2 fL (ref 80.0–100.0)
Platelets: 160 10*3/uL (ref 150–400)
RBC: 3.83 MIL/uL — ABNORMAL LOW (ref 3.87–5.11)
RDW: 14.3 % (ref 11.5–15.5)
WBC: 3.9 10*3/uL — ABNORMAL LOW (ref 4.0–10.5)
nRBC: 0 % (ref 0.0–0.2)

## 2019-08-30 LAB — HIV ANTIBODY (ROUTINE TESTING W REFLEX): HIV Screen 4th Generation wRfx: NONREACTIVE

## 2019-08-30 LAB — PHOSPHORUS: Phosphorus: 2.9 mg/dL (ref 2.5–4.6)

## 2019-08-30 LAB — LACTIC ACID, PLASMA: Lactic Acid, Venous: 1.2 mmol/L (ref 0.5–1.9)

## 2019-08-30 LAB — MAGNESIUM: Magnesium: 2 mg/dL (ref 1.7–2.4)

## 2019-08-30 MED ORDER — LOPERAMIDE HCL 2 MG PO CAPS: 2.0000 mg | ORAL_CAPSULE | ORAL | Status: DC | PRN

## 2019-08-30 MED ORDER — VANCOMYCIN HCL 1.25 G IV SOLR
1250.0000 mg | INTRAVENOUS | Status: DC
  Administered 2019-08-30 – 2019-08-31 (×2): 1250 mg via INTRAVENOUS
  Filled 2019-08-30 (×3): qty 1250

## 2019-08-30 MED ORDER — NAPROXEN 500 MG PO TABS
500.0000 mg | ORAL_TABLET | Freq: Two times a day (BID) | ORAL | Status: DC | PRN
  Filled 2019-08-30: qty 1

## 2019-08-30 MED ORDER — CLONIDINE HCL 0.1 MG PO TABS: 0.1000 mg | ORAL_TABLET | Freq: Every day | ORAL | Status: DC

## 2019-08-30 MED ORDER — CLONIDINE HCL 0.1 MG PO TABS: 0.1000 mg | ORAL_TABLET | ORAL | Status: DC

## 2019-08-30 MED ORDER — METHOCARBAMOL 500 MG PO TABS
500.0000 mg | ORAL_TABLET | Freq: Three times a day (TID) | ORAL | Status: DC | PRN
  Filled 2019-08-30: qty 1

## 2019-08-30 MED ORDER — CLONIDINE HCL 0.1 MG PO TABS
0.1000 mg | ORAL_TABLET | Freq: Four times a day (QID) | ORAL | Status: DC
  Administered 2019-08-30 – 2019-08-31 (×5): 0.1 mg via ORAL
  Filled 2019-08-30 (×5): qty 1

## 2019-08-30 MED ORDER — MORPHINE SULFATE (PF) 2 MG/ML IV SOLN
1.0000 mg | INTRAVENOUS | Status: DC | PRN
  Administered 2019-08-30 – 2019-08-31 (×4): 1 mg via INTRAVENOUS
  Filled 2019-08-30 (×4): qty 1

## 2019-08-30 MED ORDER — DICYCLOMINE HCL 20 MG PO TABS
20.0000 mg | ORAL_TABLET | Freq: Four times a day (QID) | ORAL | Status: DC | PRN
  Filled 2019-08-30: qty 1

## 2019-08-30 NOTE — Plan of Care (Signed)
  Problem: Clinical Measurements: Goal: Respiratory complications will improve Outcome: Progressing Goal: Cardiovascular complication will be avoided Outcome: Progressing   Problem: Safety: Goal: Ability to remain free from injury will improve Outcome: Progressing   Problem: Education: Goal: Knowledge of General Education information will improve Description: Including pain rating scale, medication(s)/side effects and non-pharmacologic comfort measures Outcome: Not Progressing   Problem: Health Behavior/Discharge Planning: Goal: Ability to manage health-related needs will improve Outcome: Not Progressing

## 2019-08-30 NOTE — Consult Note (Signed)
ORTHOPAEDIC CONSULTATION  REQUESTING PHYSICIAN: Eugenie Filler, MD  Chief Complaint: Right foot abscess  HPI: Amanda [name] is a 28 y.o. female who complains of right foot abscess.  Patient developed abscess over the last few days.  Was seen in the ED and I&D was performed at bedside.  Consult requested for possible further I&D if needed.  Patient very lethargic today.  Minimal review of systems provided by the patient.  Past Medical History:  Diagnosis Date  . Anxiety   . Kidney stones   . Ovarian cyst   . Pelvic pain 05/08/2013  . Urinary tract infection    Past Surgical History:  Procedure Laterality Date  . TONSILLECTOMY     Social History   Socioeconomic History  . Marital status: Single    Spouse name: Not on file  . Number of children: Not on file  . Years of education: Not on file  . Highest education level: Not on file  Occupational History  . Not on file  Social Needs  . Financial resource strain: Not on file  . Food insecurity    Worry: Not on file    Inability: Not on file  . Transportation needs    Medical: Not on file    Non-medical: Not on file  Tobacco Use  . Smoking status: Current Every Day Smoker    Packs/day: 1.00    Types: Cigarettes  . Smokeless tobacco: Never Used  Substance and Sexual Activity  . Alcohol use: Yes  . Drug use: Yes  . Sexual activity: Yes    Birth control/protection: None  Lifestyle  . Physical activity    Days per week: 4 days    Minutes per session: 60 min  . Stress: To some extent  Relationships  . Social Herbalist on phone: Not on file    Gets together: Not on file    Attends religious service: Not on file    Active member of club or organization: Not on file    Attends meetings of clubs or organizations: Not on file    Relationship status: Not on file  Other Topics Concern  . Not on file  Social History Narrative  . Not on file   Family History  Problem Relation Age of Onset  . Bipolar  disorder Mother   . Anxiety disorder Mother    No Known Allergies Prior to Admission medications   Medication Sig Start Date End Date Taking? Authorizing Provider  buprenorphine (SUBUTEX) 8 MG SUBL SL tablet TK 1 T PO BID 02/20/18  Yes [provider]  hydrOXYzine (ATARAX/VISTARIL) 25 MG tablet Take 25-50 mg by mouth every 6 (six) hours as needed. 07/29/19  Yes [provider]   Dg Chest Port 1 View  Result Date: 08/29/2019 CLINICAL DATA:  Right foot abscess for 2 days.  Cigarette smoker. EXAM: PORTABLE CHEST 1 VIEW COMPARISON:  Single-view of the chest 05/15/2012. FINDINGS: The lungs clear. Heart size normal. No pneumothorax or pleural fluid. No acute or focal bony abnormality. IMPRESSION: Normal chest. Electronically Signed   By: Inge Rise M.D.   On: 08/29/2019 09:14   Dg Foot Complete Right  Result Date: 08/29/2019 CLINICAL DATA:  Right foot redness and swelling EXAM: RIGHT FOOT COMPLETE - 3+ VIEW COMPARISON:  None. FINDINGS: There is no evidence of fracture or dislocation. No cortical destruction or periostitis. There is no evidence of arthropathy or other focal bone abnormality. There is prominent soft tissue swelling over the  dorsum of the midfoot and forefoot with a few tiny bubbles of gas in the superficial soft tissues dorsal to the naviculocuneiform joint. No radiopaque foreign body. IMPRESSION: Prominent soft tissue swelling over the dorsum of the midfoot and forefoot with a few tiny bubbles of gas in the superficial soft tissues dorsal to the naviculocuneiform joint. Findings may represent skin ulceration versus an abscess. No underlying osseous abnormality to suggest osteomyelitis. Electronically Signed   By: Duanne Guess M.D.   On: 08/29/2019 09:14    Positive ROS: All other systems have been reviewed and were otherwise negative with the exception of those mentioned in the HPI and as above.  12 point ROS was performed.  Physical Exam: General: Patient  quite lethargic today.  Vascular:  Left foot:Dorsalis Pedis:  present Posterior Tibial:  present  Right foot: Dorsalis Pedis:  present Posterior Tibial:  present  Neuro:intact and station  Derm: There is a small open draining wound on the dorsal aspect of her right foot.  At this time there is minimal surrounding erythema.  There is no purulence at all from the wound.  I flushed this with saline and attempted to express purulence but none was noted.  Ortho/MS: No edema to the right foot.  Full range of motion of the ankle though she is guarded with any range of motion to the right foot.  Assessment: Recent abscess dorsal right foot  Plan: This looks to be superficial in nature.  There is no residual purulent drainage.  There is no residual erythema.  There is an open draining wound with clear and bloody drainage from the wound at this time.  Attempts to express any purulence did not reveal any purulent drainage.  For now recommend flushing the wound with saline and applying a bulky bandage daily.  This should heal without issue but if there is any signs of recurrent abscess please do not hesitate to reconsult.     Irean Hong, DPM Cell [phone]   08/30/2019 12:42 PM

## 2019-08-30 NOTE — Consult Note (Signed)
[place] Nurse Consult Note: Reason for Consult: Right foot abscess and I & D in ED.  Further consult for podiatry.  Will defer to their service for ongoing care.  No further WOC needs at this time.  Wound type:infectious Pressure Injury POA:  NA Measurement: I & D site with surrounding erythema and edema. Purulence present.  Will not follow at this time.  Please re-consult if needed.  Domenic Moras MSN, RN, FNP-BC CWON Wound, Ostomy, Continence Nurse Pager [phone]

## 2019-08-30 NOTE — Progress Notes (Signed)
CCMD informed this RN that patient converted to afib. Dr. Grandville Silos notified. EKG ordered. EKG completed and MD informed. Patient has just been complaining of pain and agitation. PRN morphine and PRN ativan given. Refusing suboxone stating she is allergic to naloxone. MD notified of that as well.

## 2019-08-30 NOTE — TOC Progression Note (Addendum)
Transition of Care [facility]) - Progression Note    Patient Details  Name: TEHANI MERSMAN MRN: [id number] Date of Birth: 1991-10-15  Transition of Care [facility]) CM/SW [place], LCSW Phone Number: 08/30/2019, 10:35 AM  Clinical Narrative:  Received message from [place] that patient's mother left a vm for yesterday's ED RNCM. She is not here today. CSW tried calling back and left voicemail.   10:48 am: Received call back from patient's mother. She expressed concerns about patient trying to leave the hospital and hopes she will stay long enough to be treated fully. CSW provided emotional support.      Expected Discharge Plan and Services                                                 Social Determinants of Health (SDOH) Interventions    Readmission Risk Interventions No flowsheet data found.

## 2019-08-30 NOTE — Progress Notes (Signed)
PROGRESS NOTE    Amanda [name]  PYK:[id number] DOB: 03/02/1991 DOA: 08/29/2019 PCP: [facility]    Brief Narrative:  HPI per Dr. [name] is a 28 y.o. female with a history of anxiety, IV drug abuse with heroin presented to hospital with pain redness and swelling of the right foot.  Patient had stated that this had been going on for last 2 days.  She had stated that she missed the vein during injecting in the foot and subsequently started having redness swelling tenderness..  In the ED initially she was complaining of 10/ 10 pain in her right foot with fever.  I was unable to get the rest of the information.  ED Course: In the ED, patient had a bizarre behavior and was a danger to herself and she was IVCed.  Psychiatry was also consulted.  She did not have a fever but was mildly tachycardic.  WBC was of 4.9.  Lactate was 0.8.  Patient received broad-spectrum antibiotic with vancomycin and Zosyn.  I&D was done by the ED provider at bedside.  Patient was then considered for admission to the hospital for broad-spectrum antibiotics.  Patient also received IV potassium 40 mEq and magnesium sulfate 2 g x 1 in the ED.   Assessment & Plan:   Principal Problem:   Abscess of right foot Active Problems:   TOBACCO ABUSE   Hx of substance abuse (HCC)   IV drug abuse (HCC)   Anxiety   Foot abscess  1 right foot abscess Secondary to IV drug use.  Status post I&D in the ED.  Abscess cultures were sent.  Blood cultures pending.  Plain films of the right foot negative for osteomyelitis.  Continue empiric IV vancomycin and IV Zosyn pending cultures.  Will consult with podiatry for further evaluation to see if any further debridement may be needed.  2.  Polysubstance abuse Patient noted to have history of IV heroin use.  Patient noted to have some bizarre behavior in the ED.  Patient noted to be agitated overnight.  UDS which was done was positive for amphetamines, benzos,  cocaine, opiates.  Patient may be going through withdrawal.  Will place on the clonidine detox protocol.  Follow.  3.  Tobacco abuse Patient lethargic this morning.  Continue nicotine patch.  4.  Abnormal urinalysis Urine cultures pending.  Continue empiric IV Zosyn.  5.  History of anxiety Patient on the Ativan CIWA protocol.  May need to decrease dose of Ativan.  Follow.  6.  Hypokalemia Repeat labs pending.  Phosphorus at 2.9.  Replete as needed.  7.  Transaminitis Check an acute hepatitis panel.  Follow.  8.  Moderate protein calorie malnutrition Patient has been seen by dietitian.  Follow electrolytes of phosphorus, magnesium, potassium and replete as needed.   DVT prophylaxis: Lovenox Code Status: Full Family Communication: No family at bedside. Disposition Plan: Likely home when clinically improved, cultures have resulted and on oral antibiotics.   Consultants:   Podiatry pending  Psychiatry pending  Procedures:   Chest x-ray 08/29/2019  Plain films of the right foot 08/29/2019  Antimicrobials:   IV Zosyn 08/29/2019  IV vancomycin 08/29/2019   Subjective: Patient laying in bed somewhat lethargic.  Patient noted to be agitated overnight.  Patient difficult to arouse however opens eyes and drifts back off to sleep.  Protecting airway.  Objective: Vitals:   08/29/19 2329 08/29/19 2344 08/30/19 0000 08/30/19 0533  BP: 115/78 106/73  (!) 155/115  Pulse: 92 61  97  Resp: Temp:  98.6 F (37 C)  (!) 97.5 F (36.4 C)  TempSrc:  Oral  Oral  SpO2: 98% 100%  90%  Weight:   48.5 kg   Height:        Intake/Output Summary (Last 24 hours) at 08/30/2019 1015 Last data filed at 08/30/2019 0953 Gross per 24 hour  Intake 441.66 ml  Output 1075 ml  Net -633.34 ml   Filed Weights   08/29/19 0752 08/29/19 0758 08/30/19 0000  Weight: 50.3 kg 61.2 kg 48.5 kg    Examination:  General exam: Lethargic Respiratory system: Clear to auscultation.  Respiratory effort normal. Cardiovascular system: S1 & S2 heard, RRR. No JVD, murmurs, rubs, gallops or clicks. No pedal edema. Gastrointestinal system: Abdomen is nondistended, soft and nontender. No organomegaly or masses felt. Normal bowel sounds heard. Central nervous system: Alert and oriented. No focal neurological deficits. Extremities: Symmetric 5 x 5 power. Skin: Dorsum of right foot with open wound with slight erythema surrounding the area.  Psychiatry: Judgement and insight appear poor. Mood & affect appropriate.     Data Reviewed: I have personally reviewed following labs and imaging studies  CBC: Recent Labs  Lab 08/29/19 0811  WBC 4.9  NEUTROABS 2.4  HGB 11.8*  HCT 35.7*  MCV 81.9  PLT 126*   Basic Metabolic Panel: Recent Labs  Lab 08/29/19 0811 08/30/19 0002  NA 133*  --   K 2.9*  --   CL 99  --   CO2 25  --   GLUCOSE 107*  --   BUN 11  --   CREATININE 0.54  --   CALCIUM 8.6*  --   PHOS  --  2.9   GFR: Estimated Creatinine Clearance: 79.4 mL/min (by C-G formula based on SCr of 0.54 mg/dL). Liver Function Tests: Recent Labs  Lab 08/29/19 0811  AST 131*  ALT 158*  ALKPHOS 89  BILITOT 1.1  PROT 7.9  ALBUMIN 3.9   No results for input(s): LIPASE, AMYLASE in the last 168 hours. No results for input(s): AMMONIA in the last 168 hours. Coagulation Profile: No results for input(s): INR, PROTIME in the last 168 hours. Cardiac Enzymes: No results for input(s): CKTOTAL, CKMB, CKMBINDEX, TROPONINI in the last 168 hours. BNP (last 3 results) No results for input(s): PROBNP in the last 8760 hours. HbA1C: No results for input(s): HGBA1C in the last 72 hours. CBG: No results for input(s): GLUCAP in the last 168 hours. Lipid Profile: No results for input(s): CHOL, HDL, LDLCALC, TRIG, CHOLHDL, LDLDIRECT in the last 72 hours. Thyroid Function Tests: No results for input(s): TSH, T4TOTAL, FREET4, T3FREE, THYROIDAB in the last 72 hours. Anemia Panel: No  results for input(s): VITAMINB12, FOLATE, FERRITIN, TIBC, IRON, RETICCTPCT in the last 72 hours. Sepsis Labs: Recent Labs  Lab 08/29/19 0802 08/30/19 0002  LATICACIDVEN 0.8 1.2    Recent Results (from the past 240 hour(s))  Blood Culture (routine x 2)     Status: None (Preliminary result)   Collection Time: 08/29/19  8:13 AM   Specimen: BLOOD  Result Value Ref Range Status   Specimen Description BLOOD LEFT ARM  Final   Special Requests   Final    BOTTLES DRAWN AEROBIC AND ANAEROBIC Blood Culture results may not be optimal due to an excessive volume of blood received in culture bottles   Culture   Final    NO GROWTH 1 DAY Performed at [facility], [address] [place], [place] [address]    Report Status PENDING  Incomplete  Blood Culture (routine x 2)     Status: None (Preliminary result)   Collection Time: 08/29/19  8:13 AM   Specimen: BLOOD  Result Value Ref Range Status   Specimen Description BLOOD LEFT FOREARM  Final   Special Requests   Final    BOTTLES DRAWN AEROBIC AND ANAEROBIC Blood Culture adequate volume   Culture   Final    NO GROWTH 1 DAY Performed at [facility], [address] [place], [place] [address]    Report Status PENDING  Incomplete  Aerobic/Anaerobic Culture (surgical/deep wound)     Status: None (Preliminary result)   Collection Time: 08/29/19  8:16 AM   Specimen: Foot; Abscess  Result Value Ref Range Status   Specimen Description   Final    FOOT Performed at [facility], [address] [place], [place] [address]    Special Requests   Final    Normal Performed at [facility], [address] [place], [place] [address]    Gram Stain   Final    RARE WBC PRESENT, PREDOMINANTLY PMN FEW GRAM POSITIVE COCCI IN PAIRS Performed at [facility], [address] [place], [place] [address]    Culture PENDING  Incomplete   Report Status PENDING  Incomplete  SARS CORONAVIRUS 2 (TAT 6-24 HRS)  Nasopharyngeal Nasopharyngeal Swab     Status: None   Collection Time: 08/29/19  9:50 AM   Specimen: Nasopharyngeal Swab  Result Value Ref Range Status   SARS Coronavirus 2 NEGATIVE NEGATIVE Final    Comment: (NOTE) SARS-CoV-2 target nucleic acids are NOT DETECTED. The SARS-CoV-2 RNA is generally detectable in upper and lower respiratory specimens during the acute phase of infection. Negative results do not preclude SARS-CoV-2 infection, do not rule out co-infections with other pathogens, and should not be used as the sole basis for treatment or other patient management decisions. Negative results must be combined with clinical observations, patient history, and epidemiological information. The expected result is Negative. Fact Sheet for Patients: HairSlick.nohttps://www.fda.gov/media/138098/download Fact Sheet for Healthcare Providers: quierodirigir.comhttps://www.fda.gov/media/138095/download This test is not yet approved or cleared by the [place] FDA and  has been authorized for detection and/or diagnosis of SARS-CoV-2 by FDA under an Emergency Use Authorization (EUA). This EUA will remain  in effect (meaning this test can be used) for the duration of the COVID-19 declaration under Section 56 4(b)(1) of the Act, 21 U.S.C. section 360bbb-3(b)(1), unless the authorization is terminated or revoked sooner. Performed at [facility], [address] [place], [place] [address]          Radiology Studies: Dg Chest Port 1 View  Result Date: 08/29/2019 CLINICAL DATA:  Right foot abscess for 2 days.  Cigarette smoker. EXAM: PORTABLE CHEST 1 VIEW COMPARISON:  Single-view of the chest 05/15/2012. FINDINGS: The lungs clear. Heart size normal. No pneumothorax or pleural fluid. No acute or focal bony abnormality. IMPRESSION: Normal chest. Electronically Signed   By: Drusilla Kannerhomas  Dalessio M.D.   On: 08/29/2019 09:14   Dg Foot Complete Right  Result Date: 08/29/2019 CLINICAL DATA:  Right foot redness and  swelling EXAM: RIGHT FOOT COMPLETE - 3+ VIEW COMPARISON:  None. FINDINGS: There is no evidence of fracture or dislocation. No cortical destruction or periostitis. There is no evidence of arthropathy or other focal bone abnormality. There is prominent soft tissue swelling over the dorsum of the midfoot  and forefoot with a few tiny bubbles of gas in the superficial soft tissues dorsal to the naviculocuneiform joint. No radiopaque foreign body. IMPRESSION: Prominent soft tissue swelling over the dorsum of the midfoot and forefoot with a few tiny bubbles of gas in the superficial soft tissues dorsal to the naviculocuneiform joint. Findings may represent skin ulceration versus an abscess. No underlying osseous abnormality to suggest osteomyelitis. Electronically Signed   By: Davina Poke M.D.   On: 08/29/2019 09:14        Scheduled Meds:  buprenorphine-naloxone  1 tablet Sublingual Daily   cloNIDine  0.1 mg Oral QID   Followed by   Derrill Memo ON 09/01/2019] cloNIDine  0.1 mg Oral BH-qamhs   Followed by   Derrill Memo ON 09/03/2019] cloNIDine  0.1 mg Oral QAC breakfast   docusate sodium  100 mg Oral BID   enoxaparin (LOVENOX) injection  40 mg Subcutaneous M63O   folic acid  1 mg Oral Daily   influenza vac split quadrivalent PF  0.5 mL Intramuscular Tomorrow-1000   multivitamin with minerals  1 tablet Oral Daily   nicotine  21 mg Transdermal Daily   pneumococcal 23 valent vaccine  0.5 mL Intramuscular Tomorrow-1000   sodium chloride flush  3 mL Intravenous Q12H   thiamine  100 mg Oral Daily   Or   thiamine  100 mg Intravenous Daily   Continuous Infusions:  sodium chloride 125 mL/hr at 08/30/19 0013   sodium chloride     piperacillin-tazobactam (ZOSYN)  IV 3.375 g (08/30/19 0511)   vancomycin       LOS: 1 day    Time spent: 35 minutes    Irine Seal, MD [facility]  If 7PM-7AM, please contact night-coverage [email or web address] 08/30/2019, 10:15 AM

## 2019-08-30 NOTE — Progress Notes (Signed)
Patient is very restless. Administered ativan and atarax. Patient does not always respond to verbal stimuli. Patient had pulled on her iv and cardiac monitor. Sitter is not able to keep patient from removing medical devices. Patient remains active in the bed.

## 2019-08-30 NOTE — Progress Notes (Signed)
Initial Nutrition Assessment  DOCUMENTATION CODES:   Non-severe (moderate) malnutrition in context of social or environmental circumstances, Underweight  INTERVENTION:  Once Diet Advances:   Ensure Enlive BID (each supplement provides 350 calories and 20 grams of protein)  Magic cup with meals (each supplement provides 250 calories and 9 grams of protein)  Pt is at risk for refeeding sydrome monitor phosphorus, potassium, magnesium until stable.   NUTRITION DIAGNOSIS:   Moderate Malnutrition related to social / environmental circumstances(IV drug abuse, suspected inadequate oral intake) as evidenced by severe muscle depletion, moderate fat depletion, moderate muscle depletion.   GOAL:   Patient will meet greater than or equal to 90% of their needs   MONITOR:   Diet advancement, Weight trends, Skin, I & O's, Labs  REASON FOR ASSESSMENT:   Malnutrition Screening Tool, Consult Assessment of nutrition requirement/status  ASSESSMENT:   28 y.o. female admitted for abcess of right foot. PMH significant of anxiety, IV drug abuse.  Pt was very restless at the time of assessment. Case discussed with RN and NT who confirmed they were unable to obtain her vital signs because of her restlessness. Pt has history of alcohol and  IV drug abuse. Was able to give some diet history and stated that she ate three meals per day PTA. Typical meal pattern: Breakfast- cereal, Lunch- sandwich, Dinner- "some kind of home cooking." unsure of accuracy of this report due to malnutrition status. She stated that she does not have an appetite but is very thirsty. After obtaining this information, pt would no longer respond to questions.  Upon review of wt history, pt has been underweight for at least two years.  Labs reviewed: Glucose 107; Hemoglobin 11.8 low; Phosphorus 2.9 acceptable; potassium 2.9 low Medications reviewed: folic acid 1mg ; thiamine 100mg ; sodium chloride infusion; 13mL/hr    NUTRITION  - FOCUSED PHYSICAL EXAM:    Most Recent Value  Orbital Region  Moderate depletion  Upper Arm Region  Moderate depletion  Thoracic and Lumbar Region  Severe depletion  Buccal Region  Moderate depletion  Temple Region  Moderate depletion  Clavicle Bone Region  Severe depletion  Clavicle and Acromion Bone Region  Severe depletion  Scapular Bone Region  Moderate depletion  Dorsal Hand  Moderate depletion  Patellar Region  Severe depletion  Anterior Thigh Region  Severe depletion  Posterior Calf Region  Moderate depletion  Edema (RD Assessment)  None  Hair  Reviewed  Eyes  Reviewed  Mouth  Reviewed  Skin  Reviewed  Nails  Reviewed       Diet Order:   Diet Order            Diet NPO time specified Except for: Ice Chips, Sips with Meds  Diet effective now              EDUCATION NEEDS:   Not appropriate for education at this time  Skin:  Skin Assessment: Skin Integrity Issues: Skin Integrity Issues:: Incisions Incisions: right foot  Last BM:  11/12 per chart  Height:   Ht Readings from Last 1 Encounters:  08/29/19 5\' 5"  (1.651 m)    Weight:   Wt Readings from Last 1 Encounters:  08/30/19 48.5 kg    Ideal Body Weight:  56.8 kg  BMI:  Body mass index is 17.79 kg/m.  Estimated Nutritional Needs:   Kcal:  1500-1700  Protein:  70-80 grams  Fluid:  >1.5 L    Meda Klinefelter, Dietetic Intern

## 2019-08-30 NOTE — Consult Note (Addendum)
Pharmacy Antibiotic Note  Amanda [name] is a 28 y.o. female with history of IVDU, tobacco abuse admitted on 08/29/2019 with foot abscess. I&D performed in ED. CXR normal impression. Foot imaging impression of ulceration vs abscess and not c/f osteomyelitis. Wound cultures pending. Pharmacy has been consulted for vancomycin and pip/tazo dosing.   Patient has been afebrile last 24h. WBC 4.9 with no left shift on differential. Patient has received vancomycin 1 g and pip tazo 3.375 g so far this admission.   Plan: Piperacillin/tazobactam 3.375 q8h extended infusion  On Vancomycin 750 mg IV Q 12 hrs, adjusted to vancomycin 1250 mg q24H due to change in body weight. Goal AUC 400-550. Expected AUC: 508 , trough 9 SCr used: 0.8  Continue to monitor for nephrotoxicity while on this antibiotic combination. Daily Scr x 3 days per protocol while on vancomycin. Follow-up wound cultures when available.   Height: 5\' 5"  (165.1 cm) Weight: 106 lb 14.4 oz (48.5 kg) IBW/kg (Calculated) : 57  Temp (24hrs), Avg:98.1 F (36.7 C), Min:97.5 F (36.4 C), Max:98.6 F (37 C)  Recent Labs  Lab 08/29/19 0802 08/29/19 0811 08/30/19 0002  WBC  --  4.9  --   CREATININE  --  0.54  --   LATICACIDVEN 0.8  --  1.2    Estimated Creatinine Clearance: 79.4 mL/min (by C-G formula based on SCr of 0.54 mg/dL).    No Known Allergies  Antimicrobials this admission: Vancomycin 11/11 >>  Pip/tazo 11/11 >>   Dose adjustments this admission: Vancomycin 750 mg q12H > 1250 mg q24H. Used TBW for calculation.   Microbiology results: 11/11 Wound cx: pending 11/11 Ucx: pending 11/11 BCx: no growth <12h 11/11 COVID-19: negative  Thank you for allowing pharmacy to be a part of this patient's care.  Oswald Hillock, PharmD, BCPS 08/30/2019 8:26 AM

## 2019-08-31 DIAGNOSIS — F1910 Other psychoactive substance abuse, uncomplicated: Secondary | ICD-10-CM

## 2019-08-31 LAB — HEPATITIS PANEL, ACUTE
HCV Ab: REACTIVE — AB
Hep A IgM: NONREACTIVE
Hep B C IgM: NONREACTIVE
Hepatitis B Surface Ag: NONREACTIVE

## 2019-08-31 LAB — URINE CULTURE
Culture: NO GROWTH
Special Requests: NORMAL

## 2019-08-31 LAB — CBC
HCT: 28.8 % — ABNORMAL LOW (ref 36.0–46.0)
Hemoglobin: 10 g/dL — ABNORMAL LOW (ref 12.0–15.0)
MCH: 26.7 pg (ref 26.0–34.0)
MCHC: 34.7 g/dL (ref 30.0–36.0)
MCV: 76.8 fL — ABNORMAL LOW (ref 80.0–100.0)
Platelets: 143 10*3/uL — ABNORMAL LOW (ref 150–400)
RBC: 3.75 MIL/uL — ABNORMAL LOW (ref 3.87–5.11)
RDW: 14.2 % (ref 11.5–15.5)
WBC: 4.1 10*3/uL (ref 4.0–10.5)
nRBC: 0 % (ref 0.0–0.2)

## 2019-08-31 LAB — COMPREHENSIVE METABOLIC PANEL
ALT: 100 U/L — ABNORMAL HIGH (ref 0–44)
AST: 70 U/L — ABNORMAL HIGH (ref 15–41)
Albumin: 2.7 g/dL — ABNORMAL LOW (ref 3.5–5.0)
Alkaline Phosphatase: 78 U/L (ref 38–126)
Anion gap: 8 (ref 5–15)
BUN: <5 mg/dL — ABNORMAL LOW (ref 6–20)
CO2: 18 mmol/L — ABNORMAL LOW (ref 22–32)
Calcium: 8 mg/dL — ABNORMAL LOW (ref 8.9–10.3)
Chloride: 114 mmol/L — ABNORMAL HIGH (ref 98–111)
Creatinine, Ser: 0.76 mg/dL (ref 0.44–1.00)
GFR calc Af Amer: >60 mL/min (ref >60)
GFR calc non Af Amer: >60 mL/min (ref >60)
Glucose, Bld: 112 mg/dL — ABNORMAL HIGH (ref 70–99)
Potassium: 4 mmol/L (ref 3.5–5.1)
Sodium: 140 mmol/L (ref 135–145)
Total Bilirubin: 0.8 mg/dL (ref 0.3–1.2)
Total Protein: 5.6 g/dL — ABNORMAL LOW (ref 6.5–8.1)

## 2019-08-31 LAB — MAGNESIUM: Magnesium: 2 mg/dL (ref 1.7–2.4)

## 2019-08-31 LAB — PHOSPHORUS: Phosphorus: 3.6 mg/dL (ref 2.5–4.6)

## 2019-08-31 MED ORDER — DOCUSATE SODIUM 100 MG PO CAPS: 100.0000 mg | ORAL_CAPSULE | Freq: Two times a day (BID) | ORAL | 0 refills | Status: DC

## 2019-08-31 MED ORDER — ADULT MULTIVITAMIN W/MINERALS CH: 1.0000 | ORAL_TABLET | Freq: Every day | ORAL | Status: DC

## 2019-08-31 MED ORDER — FOLIC ACID 1 MG PO TABS: 1.0000 mg | ORAL_TABLET | Freq: Every day | ORAL | Status: DC

## 2019-08-31 MED ORDER — SULFAMETHOXAZOLE-TRIMETHOPRIM 800-160 MG PO TABS: 1.0000 | ORAL_TABLET | Freq: Two times a day (BID) | ORAL | 0 refills | Status: AC

## 2019-08-31 MED ORDER — AMOXICILLIN-POT CLAVULANATE 875-125 MG PO TABS: 1.0000 | ORAL_TABLET | Freq: Two times a day (BID) | ORAL | 0 refills | Status: AC

## 2019-08-31 MED ORDER — THIAMINE HCL 100 MG PO TABS: 100.0000 mg | ORAL_TABLET | Freq: Every day | ORAL | Status: DC

## 2019-08-31 MED ORDER — NAPROXEN 500 MG PO TABS: 500.0000 mg | ORAL_TABLET | Freq: Three times a day (TID) | ORAL | Status: DC | PRN

## 2019-08-31 MED ORDER — TRAMADOL HCL 50 MG PO TABS: 100.0000 mg | ORAL_TABLET | Freq: Four times a day (QID) | ORAL | Status: DC | PRN

## 2019-08-31 MED ORDER — NICOTINE 21 MG/24HR TD PT24: 21.0000 mg | MEDICATED_PATCH | Freq: Every day | TRANSDERMAL | 0 refills | Status: DC

## 2019-08-31 NOTE — Progress Notes (Signed)
PT Cancellation Note  Patient Details Name: Amanda [name] MRN: [id number] DOB: [birth year]   Cancelled Treatment:    Reason Eval/Treat Not Completed: PT screened, no needs identified, will sign off(RN reports pt AMB ad lib in room without difficulty. RN reports no need for PT evalaution at this time. PT signing off.)  1:58 PM, 08/31/19 Etta Grandchild, PT, DPT Physical Therapist - [facility]  [phone] ([place])    Kimla Furth C 08/31/2019, 1:58 PM

## 2019-08-31 NOTE — Consult Note (Signed)
  Patient without psychiatric symptoms.  Patient with appropriate outpatient follow-up for aftercare.  IVC rescinded

## 2019-08-31 NOTE — Progress Notes (Signed)
Pt given morphine and ativan for pain and anxiety. The second time PRN's were given,  the pt would brady into the 40's. On assessment the pt has no concerns and was alert with this nurse. Pt requests for PRN are becoming more frequent and pt is asking for medications that she does not have ordered or has been discontinued, such as dilaudid. Pt states she cant sleep and she wants something. Pt educated that at 04:00 it is too late for any sleep aid to be prescribed. Pt offered melatonin but she refused. Pt states she cant fall asleep but on returning to the room pt is snoring and did not wake up immediately when this nurse asked her for a pain rating. Pt seems satisfied with the Vicodin and verbalized understanding that she may be slightly oversedated with her heart rate decreasing. Pt HR currently 45-55. Will continue to monitor.

## 2019-08-31 NOTE — TOC Transition Note (Signed)
Transition of Care [facility]) - CM/SW Discharge Note   Patient Details  Name: MAKAIYA GEERDES MRN: [id number] Date of Birth: Feb 24, 1991  [facility] of [facility]) CM/SW Contact:  Candie Chroman, LCSW Phone Number: 08/31/2019, 3:30 PM   Clinical Narrative: Patient has orders to discharge home today. Left voicemail for [place] social worker, Jesusita Oka, to notify. No further concerns. CSW signing off.   Final next level of care: Home/Self Care Barriers to Discharge: No Barriers Identified   Patient Goals and CMS Choice        Discharge Placement                Patient to be transferred to facility by: [facility] will pick her up.   Patient and family notified of of transfer: 08/31/19  Discharge Plan and Services                                     Social Determinants of Health (SDOH) Interventions     Readmission Risk Interventions No flowsheet data found.

## 2019-08-31 NOTE — Discharge Summary (Signed)
Physician Discharge Summary  Amanda [name] CBJ:[id number] DOB: May 07, 1991 DOA: 08/29/2019  PCP: [facility]  Admit date: 08/29/2019 Discharge date: 08/31/2019  Time spent: 55 minutes  Recommendations for Outpatient Follow-up:  1. Follow-up with Center, [facility] 1 to 2 weeks.  On follow-up wound cultures will need to be followed up upon as there were pending on day of discharge.  Will need a basic metabolic profile done to follow-up on electrolytes and renal function as well as a CBC.   Discharge Diagnoses:  Principal Problem:   Abscess of right foot Active Problems:   TOBACCO ABUSE   Hx of substance abuse (HCC)   IV drug abuse (HCC)   Anxiety   Foot abscess   Malnutrition of moderate degree   Cellulitis of right lower extremity   Drug dependence, continuous abuse (HCC)   Discharge Condition: Stable and improved  Diet recommendation: Regular  Filed Weights   08/29/19 0752 08/29/19 0758 08/30/19 0000  Weight: 50.3 kg 61.2 kg 48.5 kg    History of present illness:  Per Dr. Harland [name] is a 28 y.o. female with a history of anxiety, IV drug abuse with heroin presented to hospital with pain redness and swelling of the right foot.  Patient had stated that this had been going on for last 2 days.  She had stated that she missed the vein during injecting in the foot and subsequently started having redness swelling tenderness..  In the ED initially she was complaining of 10/ 10 pain in her right foot with fever.  I was unable to get the rest of the information.  ED Course: In the ED, patient had a bizarre behavior and was a danger to herself and she was IVCed.  Psychiatry was also consulted.  She did not have a fever but was mildly tachycardic.  WBC was of 4.9.  Lactate was 0.8.  Patient received broad-spectrum antibiotic with vancomycin and Zosyn.  I&D was done by the ED provider at bedside.  Patient was then considered for admission to the hospital for  broad-spectrum antibiotics.  Patient also received IV potassium 40 mEq and magnesium sulfate 2 g x 1 in the ED.    Hospital Course:  1 right foot abscess Secondary to IV drug use.  Status post I&D in the ED.  Abscess cultures were sent with preliminary results with gram-positive cocci in chains.  Patient was placed empirically on IV vancomycin IV Zosyn.  Patient remained afebrile.  Patient had a normal white count.  Plain films which were done were negative for osteomyelitis.  Podiatry was consulted who assessed patient and felt no further debridement was needed.  No purulence was able to be expressed.  Patient had minimal erythema.  Per podiatry right foot abscess was superficial in nature with no purulence to be able to be drained.  It was felt there were no signs of recurrent abscess.  Patient will be discharged home on 5 more days of oral Bactrim and Augmentin to complete a 7-day course of antibiotic treatment.  Outpatient follow-up with PCP.  2.  Polysubstance abuse Patient noted to have history of IV heroin use.  Patient noted to have some bizarre behavior in the ED.  Patient noted to be agitated overnight.  UDS which was done was positive for amphetamines, benzos, cocaine, opiates.  Patient was thought to have been going through withdrawal and patient placed on the clonidine detox protocol.  Patient improved clinically.  Patient was seen by psychiatry and  was felt patient was not an imminent risk to herself or others and cleared by psychiatry for discharge with close outpatient follow-up care.  Patient was initially IVC than that was rescinded by psychiatry.  Polysubstance cessation was stressed to patient.    3.  Tobacco abuse Tobacco cessation stressed to patient.  Patient was maintained on a nicotine patch.  Outpatient follow-up.   4.  Abnormal urinalysis Urine cultures which were done were negative.  Patient remained asymptomatic.   5.  History of anxiety Patient was placed on the  Ativan CIWA protocol.  Patient's anxiety remained stable.  Outpatient follow-up.   6.  Hypokalemia Repleted.  On day of discharge potassium was 4.0.   7.  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  Outpatient follow-up with PCP for further management.   8.  Moderate protein calorie malnutrition Patient has been seen by dietitian.  Patient's electrolytes were repleted.  Outpatient follow-up.   Procedures:  Chest x-ray 08/29/2019  Plain films of the right foot 08/29/2019   Consultations:  Psychiatry: Dr. Lucianne Muss 08/30/2019  Curb sided ID.  Podiatry: Dr. Ether Griffins 08/30/2019  Discharge Exam: Vitals:   08/31/19 0353 08/31/19 0721  BP: 109/78 110/73  Pulse: (!) 50 (!) 53  Resp: 18 13  Temp: 97.6 F (36.4 C) (!) 97.4 F (36.3 C)  SpO2: 100% 100%    General: NAD Cardiovascular: RRR Respiratory: CTAB  Discharge Instructions   Discharge Instructions    Diet general   Complete by: As directed    Increase activity slowly   Complete by: As directed      Allergies as of 08/31/2019      Reactions   Naloxone    Per patient she is allergic but could not explain well enough how       Medication List    TAKE these medications   amoxicillin-clavulanate 875-125 MG tablet Commonly known as: Augmentin Take 1 tablet by mouth 2 (two) times daily for 5 days.   buprenorphine 8 MG Subl SL tablet Commonly known as: SUBUTEX TK 1 T PO BID   docusate sodium 100 MG capsule Commonly known as: COLACE Take 1 capsule (100 mg total) by mouth 2 (two) times daily.   folic acid 1 MG tablet Commonly known as: FOLVITE Take 1 tablet (1 mg total) by mouth daily. Start taking on: September 01, 2019   hydrOXYzine 25 MG tablet Commonly known as: ATARAX/VISTARIL Take 25-50 mg by mouth every 6 (six) hours as needed.   multivitamin with minerals Tabs tablet Take 1 tablet by mouth daily. Start taking on:  September 01, 2019   naproxen 500 MG tablet Commonly known as: NAPROSYN Take 1 tablet (500 mg total) by mouth 3 (three) times daily as needed for mild pain (aching, pain, or discomfort).   nicotine 21 mg/24hr patch Commonly known as: NICODERM CQ - dosed in mg/24 hours Place 1 patch (21 mg total) onto the skin daily. Start taking on: September 01, 2019   sulfamethoxazole-trimethoprim 800-160 MG tablet Commonly known as: BACTRIM DS Take 1 tablet by mouth 2 (two) times daily for 5 days.   thiamine 100 MG tablet Take 1 tablet (100 mg total) by mouth daily. Start taking on: September 01, 2019      Allergies  Allergen Reactions  . Naloxone     Per patient she is allergic but could not explain well enough how    Follow-up Information  [facility], [facility]. Schedule an appointment as soon as possible for a visit in 2 week(s).   Why: f/u in 1-2 weeks Contact information: [address] [facility] [place] [address] [phone]            The results of significant diagnostics from this hospitalization (including imaging, microbiology, ancillary and laboratory) are listed below for reference.    Significant Diagnostic Studies: Dg Chest Port 1 View  Result Date: 08/29/2019 CLINICAL DATA:  Right foot abscess for 2 days.  Cigarette smoker. EXAM: PORTABLE CHEST 1 VIEW COMPARISON:  Single-view of the chest 05/15/2012. FINDINGS: The lungs clear. Heart size normal. No pneumothorax or pleural fluid. No acute or focal bony abnormality. IMPRESSION: Normal chest. Electronically Signed   By: Drusilla Kanner M.D.   On: 08/29/2019 09:14   Dg Foot Complete Right  Result Date: 08/29/2019 CLINICAL DATA:  Right foot redness and swelling EXAM: RIGHT FOOT COMPLETE - 3+ VIEW COMPARISON:  None. FINDINGS: There is no evidence of fracture or dislocation. No cortical destruction or periostitis. There is no evidence of arthropathy or other focal bone abnormality. There is prominent soft tissue  swelling over the dorsum of the midfoot and forefoot with a few tiny bubbles of gas in the superficial soft tissues dorsal to the naviculocuneiform joint. No radiopaque foreign body. IMPRESSION: Prominent soft tissue swelling over the dorsum of the midfoot and forefoot with a few tiny bubbles of gas in the superficial soft tissues dorsal to the naviculocuneiform joint. Findings may represent skin ulceration versus an abscess. No underlying osseous abnormality to suggest osteomyelitis. Electronically Signed   By: Duanne Guess M.D.   On: 08/29/2019 09:14    Microbiology: Recent Results (from the past 240 hour(s))  Blood Culture (routine x 2)     Status: None (Preliminary result)   Collection Time: 08/29/19  8:13 AM   Specimen: BLOOD  Result Value Ref Range Status   Specimen Description BLOOD LEFT ARM  Final   Special Requests   Final    BOTTLES DRAWN AEROBIC AND ANAEROBIC Blood Culture results may not be optimal due to an excessive volume of blood received in culture bottles   Culture   Final    NO GROWTH 2 DAYS Performed at [facility], [address] [place], [place] [address]    Report Status PENDING  Incomplete  Blood Culture (routine x 2)     Status: None (Preliminary result)   Collection Time: 08/29/19  8:13 AM   Specimen: BLOOD  Result Value Ref Range Status   Specimen Description BLOOD LEFT FOREARM  Final   Special Requests   Final    BOTTLES DRAWN AEROBIC AND ANAEROBIC Blood Culture adequate volume   Culture   Final    NO GROWTH 2 DAYS Performed at [facility], [address] [place], [place] [address]    Report Status PENDING  Incomplete  Aerobic/Anaerobic Culture (surgical/deep wound)     Status: None (Preliminary result)   Collection Time: 08/29/19  8:16 AM   Specimen: Foot; Abscess  Result Value Ref Range Status   Specimen Description   Final    FOOT Performed at [facility], [address] [place], [place] [address]    Special  Requests   Final    Normal Performed at [facility], [address] [place], [place] [address]    Gram Stain   Final    RARE WBC PRESENT, PREDOMINANTLY PMN FEW GRAM POSITIVE COCCI IN PAIRS  Culture   Final    FEW VIRIDANS STREPTOCOCCUS RARE SERRATIA MARCESCENS CULTURE REINCUBATED FOR BETTER GROWTH Performed at [facility], [address] [place], [place] [address]    Report Status PENDING  Incomplete  SARS CORONAVIRUS 2 (TAT 6-24 HRS) Nasopharyngeal Nasopharyngeal Swab     Status: None   Collection Time: 08/29/19  9:50 AM   Specimen: Nasopharyngeal Swab  Result Value Ref Range Status   SARS Coronavirus 2 NEGATIVE NEGATIVE Final    Comment: (NOTE) SARS-CoV-2 target nucleic acids are NOT DETECTED. The SARS-CoV-2 RNA is generally detectable in upper and lower respiratory specimens during the acute phase of infection. Negative results do not preclude SARS-CoV-2 infection, do not rule out co-infections with other pathogens, and should not be used as the sole basis for treatment or other patient management decisions. Negative results must be combined with clinical observations, patient history, and epidemiological information. The expected result is Negative. Fact Sheet for Patients: HairSlick.nohttps://www.fda.gov/media/138098/download Fact Sheet for Healthcare Providers: quierodirigir.comhttps://www.fda.gov/media/138095/download This test is not yet approved or cleared by the [place] FDA and  has been authorized for detection and/or diagnosis of SARS-CoV-2 by FDA under an Emergency Use Authorization (EUA). This EUA will remain  in effect (meaning this test can be used) for the duration of the COVID-19 declaration under Section 56 4(b)(1) of the Act, 21 U.S.C. section 360bbb-3(b)(1), unless the authorization is terminated or revoked sooner. Performed at [facility], [address] [place], [place] [address]   Urine culture     Status: None   Collection Time: 08/29/19   2:02 PM   Specimen: In/Out Cath Urine  Result Value Ref Range Status   Specimen Description   Final    IN/OUT CATH URINE Performed at [facility], [address] [place], [place] [address]    Special Requests   Final    Normal Performed at [facility], [address] [place], [place] [address]    Culture   Final    NO GROWTH Performed at [facility], [address] [place], [place] [address]    Report Status 08/31/2019 FINAL  Final     Labs: Basic Metabolic Panel: Recent Labs  Lab 08/29/19 0811 08/30/19 0002 08/30/19 1902 08/31/19 0810  NA 133*  --  140 140  K 2.9*  --  3.6 4.0  CL 99  --  112* 114*  CO2 25  --  18* 18*  GLUCOSE 107*  --  130* 112*  BUN 11  --  5* <5*  CREATININE 0.54  --  0.66 0.76  CALCIUM 8.6*  --  8.5* 8.0*  MG  --   --  2.0 2.0  PHOS  --  2.9  --  3.6   Liver Function Tests: Recent Labs  Lab 08/29/19 0811 08/30/19 1902 08/31/19 0810  AST 131* 91* 70*  ALT 158* 120* 100*  ALKPHOS 89 83 78  BILITOT 1.1 0.7 0.8  PROT 7.9 6.6 5.6*  ALBUMIN 3.9 3.0* 2.7*   No results for input(s): LIPASE, AMYLASE in the last 168 hours. No results for input(s): AMMONIA in the last 168 hours. CBC: Recent Labs  Lab 08/29/19 0811 08/30/19 1902 08/31/19 0810  WBC 4.9 3.9* 4.1  NEUTROABS 2.4  --   --   HGB 11.8* 10.4* 10.0*  HCT 35.7* 30.7* 28.8*  MCV 81.9 80.2 76.8*  PLT 126* 160 143*   Cardiac Enzymes: No results for input(s): CKTOTAL, CKMB, CKMBINDEX, TROPONINI in  the last 168 hours. BNP: BNP (last 3 results) No results for input(s): BNP in the last 8760 hours.  ProBNP (last 3 results) No results for input(s): PROBNP in the last 8760 hours.  CBG: No results for input(s): GLUCAP in the last 168 hours.     Signed:  Irine Seal MD.  [facility] 08/31/2019, 3:24 PM

## 2019-08-31 NOTE — Progress Notes (Signed)
OT Cancellation Note  Patient Details Name: TSION INGHRAM MRN: [id number] DOB: May 21, 1991   Cancelled Treatment:     OT order received, patient screened and has no OT needs at this time.  She is independent with self care tasks, has been up to the bathroom independently and has been walking laps around the nursing station with the aide. Please reconsult if needs arise.   Amy T Tomasita Morrow, OTR/L, CLT   Lovett,Amy 08/31/2019, 12:35 PM

## 2019-08-31 NOTE — Consult Note (Signed)
Pharmacy Antibiotic Note  ARIENNE GARTIN is a 28 y.o. female with history of IVDU, tobacco abuse admitted on 08/29/2019 with foot abscess. I&D performed in ED. CXR normal impression. Foot imaging impression of ulceration vs abscess and not c/f osteomyelitis. Wound cultures pending. Pharmacy has been consulted for vancomycin and pip/tazo dosing. Foot abscess cx growing FEW GRAM POSITIVE COCCI IN PAIRS.   Patient has been afebrile last 24h. WBC 4.9 with no left shift on differential. Patient has received vancomycin 1 g and pip tazo 3.375 g so far this admission.   Plan: Day 3 of abx. Will continue piperacillin/tazobactam 3.375 q8h extended infusion  Will continue vancomycin 1250 mg q24H due to change in body weight. Goal AUC 400-550. Will need levels in 3 days.  Expected AUC: 508 , trough 9 SCr used: 0.8  Continue to monitor for nephrotoxicity while on this antibiotic combination. Daily Scr x 3 days per protocol while on vancomycin. Follow-up wound cultures when available.   Height: 5\' 5"  (165.1 cm) Weight: 106 lb 14.4 oz (48.5 kg) IBW/kg (Calculated) : 57  Temp (24hrs), Avg:97.9 F (36.6 C), Min:97.4 F (36.3 C), Max:98.4 F (36.9 C)  Recent Labs  Lab 08/29/19 0802 08/29/19 0811 08/30/19 0002 08/30/19 1902 08/31/19 0810  WBC  --  4.9  --  3.9* 4.1  CREATININE  --  0.54  --  0.66 0.76  LATICACIDVEN 0.8  --  1.2  --   --     Estimated Creatinine Clearance: 79.4 mL/min (by C-G formula based on SCr of 0.76 mg/dL).    Allergies  Allergen Reactions  . Naloxone     Per patient she is allergic but could not explain well enough how     Antimicrobials this admission: Vancomycin 11/11 >>  Pip/tazo 11/11 >>   Dose adjustments this admission: Vancomycin 750 mg q12H > 1250 mg q24H. Used TBW for calculation.   Microbiology results: 11/11 Wound cx: pending 11/11 Ucx: pending 11/11 BCx: no growth <12h 11/11 COVID-19: negative  Thank you for allowing pharmacy to be a part of  this patient's care.  Oswald Hillock, PharmD, BCPS 08/31/2019 9:36 AM

## 2019-08-31 NOTE — TOC Progression Note (Addendum)
Transition of Care [facility]) - Progression Note    Patient Details  Name: Amanda [name] MRN: [id number] Date of Birth: 1990-10-22  [facility] CM/SW [place], LCSW Phone Number: 08/31/2019, 11:50 AM  Clinical Narrative:  Met with patient to discuss discharge plan. Patient confirmed she follows up at a [facility]). She goes to NA groups every other week. CSW provided [facility] which includes other treatment center options. She was not interested in any other resources. She said her fiance' will pick her up once she is discharged.   2:37 pm: Received call from [place]. They will be calling and having someone from [place] come to the hospital to interview patient. Gave CPS worker front desk number so they can notify them of the name of the person coming.    Expected Discharge Plan and Services                                                 Social Determinants of Health (SDOH) Interventions    Readmission Risk Interventions No flowsheet data found.

## 2019-08-31 NOTE — BH Assessment (Signed)
IVC PAPERS  RESCINDED BY  Abiha Lukehart  IN  THE  EMERGENCY  DEPT  INFORMED  UNIT  SEC ON THE  FLOOR AND TUBED  PAPERS UP

## 2019-08-31 NOTE — Progress Notes (Signed)
OT Cancellation Note  Patient Details Name: TRULEE HAMSTRA MRN: [id number] DOB: 10-27-1990   Cancelled Treatment:     Attempted to see patient this am for OT evaluation, patient sleeping and difficulty to arouse for active participation in session, will continue attempts.   Amy T Lovett, OTR/L, CLT   Lovett,Amy 08/31/2019, 9:45 AM

## 2019-09-02 LAB — AEROBIC/ANAEROBIC CULTURE W GRAM STAIN (SURGICAL/DEEP WOUND): Special Requests: NORMAL

## 2019-09-03 LAB — CULTURE, BLOOD (ROUTINE X 2)
Culture: NO GROWTH
Culture: NO GROWTH
Special Requests: ADEQUATE

## 2019-11-26 ENCOUNTER — Telehealth: Payer: Self-pay

## 2019-11-26 NOTE — Telephone Encounter (Signed)
Pt calling; has very bad bladder inf; has numbers she can give [place] when we call.  Would like something called in.  [phone]  Mailbox is full.

## 2019-11-27 NOTE — Telephone Encounter (Signed)
Was able to leave detailed msg this am for pt to call and let us know what is going on.  Whoever checked her urine probably sent it off for a culture and that is what we would do.

## 2019-11-28 NOTE — Telephone Encounter (Signed)
Left second detailed msg. 

## 2019-11-30 NOTE — Telephone Encounter (Signed)
Pt hasn't returned call.  Msg closed. 

## 2020-08-15 ENCOUNTER — Telehealth: Payer: Self-pay

## 2020-08-15 NOTE — Telephone Encounter (Signed)
Voicemail box is full unable to leave voicemail

## 2020-08-15 NOTE — Telephone Encounter (Signed)
Pt calling needing an appt for vagina BV. Please help schedule. [phone]

## 2020-08-18 ENCOUNTER — Ambulatory Visit: Payer: Medicaid Other | Admitting: Obstetrics and Gynecology

## 2020-09-01 NOTE — Progress Notes (Signed)
Center, [facility]   Chief Complaint  Patient presents with  . Urinary Tract Infection    frequency and burning urinating x 1-2 months  . Vaginal Discharge    no odor, itchiness, or irritation x 3 weeks    HPI:      Ms. Amanda [name] is a 29 y.o. [id number] whose LMP was Patient's last menstrual period was 08/18/2020 (exact date)., presents today for UTI symptoms of urinary frequency with decreased flow, dysuria if doesn't drink a lot of water, for a couple months. No hematuria, pelvic pain, fevers. Has occas LT LBP if holds urine. Drinks 1 1/2 caffeinated drinks daily. Had pos UA a couple months ago at tx center but never went back for tx. Hx of UTIs in past  She has also had increased yellow d/c with urine odor, but denies incont. No itching/irritaiton/fishy odor. Sx for a few wks, no meds to treat. Hx of BV in past.   She is sex active, not using BC. Declines BC. No new partners, no recent STD testing. No pain/bleeding. Menses are monthly, last a wk, heavy flow.    Past Medical History:  Diagnosis Date  . Anxiety   . Kidney stones   . Ovarian cyst   . Pelvic pain 05/08/2013  . Urinary tract infection     Past Surgical History:  Procedure Laterality Date  . TONSILLECTOMY      Family History  Problem Relation Age of Onset  . Bipolar disorder Mother   . Anxiety disorder Mother     Social History   Socioeconomic History  . Marital status: Single    Spouse name: Not on file  . Number of children: Not on file  . Years of education: Not on file  . Highest education level: Not on file  Occupational History  . Not on file  Tobacco Use  . Smoking status: Current Every Day Smoker    Packs/day: 1.00    Types: Cigarettes  . Smokeless tobacco: Never Used  Vaping Use  . Vaping Use: Never used  Substance and Sexual Activity  . Alcohol use: Yes  . Drug use: Yes  . Sexual activity: Yes    Birth control/protection: None  Other Topics Concern  . Not on file    Social History Narrative  . Not on file   Social Determinants of Health   Financial Resource Strain:   . Difficulty of Paying Living Expenses: Not on file  Food Insecurity:   . Worried About Programme researcher, broadcasting/film/video in the Last Year: Not on file  . Ran Out of Food in the Last Year: Not on file  Transportation Needs:   . Lack of Transportation (Medical): Not on file  . Lack of Transportation (Non-Medical): Not on file  Physical Activity:   . Days of Exercise per Week: Not on file  . Minutes of Exercise per Session: Not on file  Stress:   . Feeling of Stress : Not on file  Social Connections:   . Frequency of Communication with Friends and Family: Not on file  . Frequency of Social Gatherings with Friends and Family: Not on file  . Attends Religious Services: Not on file  . Active Member of Clubs or Organizations: Not on file  . Attends Banker Meetings: Not on file  . Marital Status: Not on file  Intimate Partner Violence:   . Fear of Current or Ex-Partner: Not on file  . Emotionally Abused: Not on file  .  Physically Abused: Not on file  . Sexually Abused: Not on file    Outpatient Medications Prior to Visit  Medication Sig Dispense Refill  . buprenorphine (SUBUTEX) 8 MG SUBL SL tablet TK 1 T PO BID  0  . docusate sodium (COLACE) 100 MG capsule Take 1 capsule (100 mg total) by mouth 2 (two) times daily. 10 capsule 0  . folic acid (FOLVITE) 1 MG tablet Take 1 tablet (1 mg total) by mouth daily.    . hydrOXYzine (ATARAX/VISTARIL) 25 MG tablet Take 25-50 mg by mouth every 6 (six) hours as needed.    . Multiple Vitamin (MULTIVITAMIN WITH MINERALS) TABS tablet Take 1 tablet by mouth daily.    . naproxen (NAPROSYN) 500 MG tablet Take 1 tablet (500 mg total) by mouth 3 (three) times daily as needed for mild pain (aching, pain, or discomfort).    . nicotine (NICODERM CQ - DOSED IN MG/24 HOURS) 21 mg/24hr patch Place 1 patch (21 mg total) onto the skin daily. 28 patch 0  .  thiamine 100 MG tablet Take 1 tablet (100 mg total) by mouth daily.     No facility-administered medications prior to visit.      ROS:  Review of Systems  Constitutional: Negative for fever.  Gastrointestinal: Negative for blood in stool, constipation, diarrhea, nausea and vomiting.  Genitourinary: Positive for dysuria, frequency and vaginal discharge. Negative for dyspareunia, flank pain, hematuria, urgency, vaginal bleeding and vaginal pain.  Musculoskeletal: Negative for back pain.  Skin: Negative for rash.    OBJECTIVE:   Vitals:  BP 90/60   Ht 5\' 5"  (1.651 m)   Wt 156 lb (70.8 kg)   LMP 08/18/2020 (Exact Date)   BMI 25.96 kg/m   Physical Exam Vitals reviewed.  Constitutional:      Appearance: She is well-developed.  Pulmonary:     Effort: Pulmonary effort is normal.  Genitourinary:    General: Normal vulva.     Pubic Area: No rash.      Labia:        Right: No rash, tenderness or lesion.        Left: No rash, tenderness or lesion.      Vagina: Vaginal discharge present. No erythema or tenderness.     Cervix: Normal.     Uterus: Normal. Not enlarged and not tender.      Adnexa: Right adnexa normal and left adnexa normal.       Right: No mass or tenderness.         Left: No mass or tenderness.    Musculoskeletal:        General: Normal range of motion.     Cervical back: Normal range of motion.  Skin:    General: Skin is warm and dry.  Neurological:     General: No focal deficit present.     Mental Status: She is alert and oriented to person, place, and time.  Psychiatric:        Mood and Affect: Mood normal.        Behavior: Behavior normal.        Thought Content: Thought content normal.        Judgment: Judgment normal.     Results: Results for orders placed or performed in visit on 09/02/20 (from the past 24 hour(s))  POCT Wet Prep with KOH     Status: Normal   Collection Time: 09/02/20 11:57 AM  Result Value Ref Range   Trichomonas, UA  Negative  Clue Cells Wet Prep HPF POC neg    Epithelial Wet Prep HPF POC     Yeast Wet Prep HPF POC neg    Bacteria Wet Prep HPF POC     RBC Wet Prep HPF POC     WBC Wet Prep HPF POC     KOH Prep POC Negative Negative  POCT Urinalysis Dipstick     Status: Normal   Collection Time: 09/02/20 11:57 AM  Result Value Ref Range   Color, UA yellow    Clarity, UA clear    Glucose, UA Negative Negative   Bilirubin, UA neg    Ketones, UA neg    Spec Grav, UA 1.015 1.010 - 1.025   Blood, UA neg    pH, UA 7.0 5.0 - 8.0   Protein, UA Negative Negative   Urobilinogen, UA     Nitrite, UA neg    Leukocytes, UA Negative Negative   Appearance     Odor       Assessment/Plan: Vaginal discharge - Plan: NuSwab Vaginitis Plus (VG+), POCT Wet Prep with KOH; Pos sx and neg wet prep. Check Culture. Will f/u if pos. If neg, normal d/c. Reassurance. F/u prn.   Screening for STD (sexually transmitted disease) - Plan: NuSwab Vaginitis Plus (VG+)  UTI symptoms - Plan: POCT Urinalysis Dipstick, Urine Culture; neg UA. Check C&S. Will f/u if pos. D/C caffeine, increase water. F/u prn.     Return if symptoms worsen or fail to improve.  Gayle Martinez B. Joangel Vanosdol, PA-C 09/02/2020 11:59 AM

## 2020-09-02 ENCOUNTER — Other Ambulatory Visit: Payer: Self-pay

## 2020-09-02 ENCOUNTER — Ambulatory Visit (INDEPENDENT_AMBULATORY_CARE_PROVIDER_SITE_OTHER): Payer: Medicaid Other | Admitting: Obstetrics and Gynecology

## 2020-09-02 ENCOUNTER — Encounter: Payer: Self-pay | Admitting: Obstetrics and Gynecology

## 2020-09-02 VITALS — BP 90/60 | Ht 65.0 in | Wt 156.0 lb

## 2020-09-02 DIAGNOSIS — R399 Unspecified symptoms and signs involving the genitourinary system: Secondary | ICD-10-CM

## 2020-09-02 DIAGNOSIS — N898 Other specified noninflammatory disorders of vagina: Secondary | ICD-10-CM | POA: Diagnosis not present

## 2020-09-02 DIAGNOSIS — Z113 Encounter for screening for infections with a predominantly sexual mode of transmission: Secondary | ICD-10-CM | POA: Diagnosis not present

## 2020-09-02 LAB — POCT URINALYSIS DIPSTICK
Bilirubin, UA: NEGATIVE
Blood, UA: NEGATIVE
Glucose, UA: NEGATIVE
Ketones, UA: NEGATIVE
Leukocytes, UA: NEGATIVE
Nitrite, UA: NEGATIVE
Protein, UA: NEGATIVE
Spec Grav, UA: 1.015 (ref 1.010–1.025)
pH, UA: 7 (ref 5.0–8.0)

## 2020-09-02 LAB — POCT WET PREP WITH KOH
Clue Cells Wet Prep HPF POC: NEGATIVE
KOH Prep POC: NEGATIVE
Trichomonas, UA: NEGATIVE
Yeast Wet Prep HPF POC: NEGATIVE

## 2020-09-02 NOTE — Patient Instructions (Signed)
I value your feedback and entrusting us with your care. If you get a [facility] patient survey, I would appreciate you taking the time to let us know about your experience today. Thank you!  As of September 27, 2019, your lab results will be released to your MyChart immediately, before I even have a chance to see them. Please give me time to review them and contact you if there are any abnormalities. Thank you for your patience.  

## 2020-09-04 LAB — URINE CULTURE

## 2020-09-05 LAB — NUSWAB VAGINITIS PLUS (VG+)
Candida albicans, NAA: NEGATIVE
Candida glabrata, NAA: NEGATIVE
Chlamydia trachomatis, NAA: NEGATIVE
Neisseria gonorrhoeae, NAA: NEGATIVE
Trich vag by NAA: NEGATIVE
